# Patient Record
Sex: Male | Born: 1995 | Race: White | Hispanic: No | Marital: Single | Smoking: Never smoker
Health system: Southern US, Community
[De-identification: ages and names within clinical notes are randomized; demographics above are authoritative.]

## PROBLEM LIST (undated history)

## (undated) DIAGNOSIS — C6290 Malignant neoplasm of unspecified testis, unspecified whether descended or undescended: Secondary | ICD-10-CM

## (undated) DIAGNOSIS — Z9889 Other specified postprocedural states: Secondary | ICD-10-CM

## (undated) DIAGNOSIS — F411 Generalized anxiety disorder: Secondary | ICD-10-CM

## (undated) DIAGNOSIS — F329 Major depressive disorder, single episode, unspecified: Secondary | ICD-10-CM

## (undated) DIAGNOSIS — N5089 Other specified disorders of the male genital organs: Secondary | ICD-10-CM

## (undated) DIAGNOSIS — L309 Dermatitis, unspecified: Secondary | ICD-10-CM

## (undated) DIAGNOSIS — C801 Malignant (primary) neoplasm, unspecified: Secondary | ICD-10-CM

## (undated) DIAGNOSIS — Z973 Presence of spectacles and contact lenses: Secondary | ICD-10-CM

---
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
## 2017-01-31 ENCOUNTER — Ambulatory Visit (INDEPENDENT_AMBULATORY_CARE_PROVIDER_SITE_OTHER): Payer: 59 | Admitting: Psychology

## 2017-01-31 DIAGNOSIS — F331 Major depressive disorder, recurrent, moderate: Secondary | ICD-10-CM | POA: Diagnosis not present

## 2017-02-09 ENCOUNTER — Ambulatory Visit (INDEPENDENT_AMBULATORY_CARE_PROVIDER_SITE_OTHER): Payer: 59 | Admitting: Psychology

## 2017-02-09 DIAGNOSIS — F331 Major depressive disorder, recurrent, moderate: Secondary | ICD-10-CM | POA: Diagnosis not present

## 2017-02-16 ENCOUNTER — Ambulatory Visit: Payer: Self-pay | Admitting: Psychology

## 2017-02-21 ENCOUNTER — Ambulatory Visit (INDEPENDENT_AMBULATORY_CARE_PROVIDER_SITE_OTHER): Payer: 59 | Admitting: Psychology

## 2017-02-21 DIAGNOSIS — F331 Major depressive disorder, recurrent, moderate: Secondary | ICD-10-CM | POA: Diagnosis not present

## 2017-02-27 ENCOUNTER — Ambulatory Visit (INDEPENDENT_AMBULATORY_CARE_PROVIDER_SITE_OTHER): Payer: 59 | Admitting: Psychology

## 2017-02-27 DIAGNOSIS — F331 Major depressive disorder, recurrent, moderate: Secondary | ICD-10-CM

## 2017-03-27 ENCOUNTER — Encounter: Payer: Self-pay | Admitting: Family

## 2017-03-27 ENCOUNTER — Ambulatory Visit (INDEPENDENT_AMBULATORY_CARE_PROVIDER_SITE_OTHER): Payer: 59 | Admitting: Family

## 2017-03-27 VITALS — BP 110/70 | HR 94 | Temp 98.2°F | Resp 16 | Ht 68.0 in | Wt 129.8 lb

## 2017-03-27 DIAGNOSIS — Z0000 Encounter for general adult medical examination without abnormal findings: Secondary | ICD-10-CM

## 2017-03-27 DIAGNOSIS — Z00129 Encounter for routine child health examination without abnormal findings: Principal | ICD-10-CM

## 2017-03-27 NOTE — Progress Notes (Signed)
Subjective:    Patient ID: Ricky Williams, male    DOB: 1995/09/29, 21 y.o.   MRN: [id number]  Chief Complaint  Patient presents with  . Establish Care    CPE    HPI:  Ricky Williams is a 21 y.o. male who presents today for an annual wellness visit.His mother is present today for the office visit with his permission.   1) Health Maintenance -   Diet - Averaging about 3 meals per day with snacks; No caffeine intake  Exercise - No structured exercise   2) Preventative Exams / Immunizations:  Dental -- Up to date  Vision -- Up to date   Health Maintenance  Topic Date Due  . HIV Screening  03/12/2013  . TETANUS/TDAP  03/12/2017  . INFLUENZA VACCINE  04/05/2017     There is no immunization history on file for this patient.   No Known Allergies   Outpatient Medications Prior to Visit  Medication Sig Dispense Refill  . Multiple Vitamins-Minerals (MULTIVITAMIN WITH MINERALS) tablet Take 1 tablet by mouth daily.    Marland Kitchen acetaminophen (TYLENOL) 500 MG tablet Take 500-1,000 mg by mouth every 6 (six) hours as needed for mild pain.    . cephALEXin (KEFLEX) 500 MG capsule Take 1 capsule (500 mg total) by mouth 3 (three) times daily. 30 capsule 0  . cyproheptadine (PERIACTIN) 4 MG tablet Take 4 mg by mouth daily.    Marland Kitchen dicyclomine (BENTYL) 20 MG tablet Take 1 tablet (20 mg total) by mouth 4 (four) times daily -  before meals and at bedtime. For belly pain 30 tablet 0  . omeprazole (PRILOSEC) 40 MG capsule Take 40 mg by mouth 2 (two) times daily.    . ondansetron (ZOFRAN) 4 MG tablet Take 4 mg by mouth every 8 (eight) hours as needed for nausea or vomiting.    . Vitamin D, Ergocalciferol, (DRISDOL) 50000 UNITS CAPS capsule Take 50,000 Units by mouth every 7 (seven) days.     No facility-administered medications prior to visit.      Past Medical History:  Diagnosis Date  . Allergy   . Anxiety   . Depression      History reviewed. No pertinent surgical  history.   Family History  Problem Relation Age of Onset  . Breast cancer Mother   . Prostate cancer Father   . Kidney Stones Father   . Arthritis Father        Psoriatic   . Multiple myeloma Maternal Grandmother   . Melanoma Maternal Grandfather   . Breast cancer Paternal Grandmother   . Healthy Paternal Grandfather      Social History   Social History  . Marital status: Single    Spouse name: N/A  . Number of children: 0  . Years of education: 12   Occupational History  . Student     [employer] about Biology   Social History Main Topics  . Smoking status: Never Smoker  . Smokeless tobacco: Never Used  . Alcohol use No  . Drug use: No  . Sexual activity: No   Other Topics Concern  . Not on file   Social History Narrative   Fun/Hobby: Video games       Review of Systems  Constitutional: Denies fever, chills, fatigue, or significant weight gain/loss. HENT: Head: Denies headache or neck pain Ears: Denies changes in hearing, ringing in ears, earache, drainage Nose: Denies discharge, stuffiness, itching, nosebleed, sinus pain Throat: Denies sore throat,  hoarseness, dry mouth, sores, thrush Eyes: Denies loss/changes in vision, pain, redness, blurry/double vision, flashing lights Cardiovascular: Denies chest pain/discomfort, tightness, palpitations, shortness of breath with activity, difficulty lying down, swelling, sudden awakening with shortness of breath Respiratory: Denies shortness of breath, cough, sputum production, wheezing Gastrointestinal: Denies dysphasia, heartburn, change in appetite, nausea, change in bowel habits, rectal bleeding, constipation, diarrhea, yellow skin or eyes Genitourinary: Denies frequency, urgency, burning/pain, blood in urine, incontinence, change in urinary strength. Musculoskeletal: Denies muscle/joint pain, stiffness, back pain, redness or swelling of joints, trauma Skin: Denies rashes, lumps, itching, dryness, color changes,  or hair/nail changes Neurological: Denies dizziness, fainting, seizures, weakness, numbness, tingling, tremor Psychiatric - Denies nervousness, stress, depression or memory loss Endocrine: Denies heat or cold intolerance, sweating, frequent urination, excessive thirst, changes in appetite Hematologic: Denies ease of bruising or bleeding     Objective:     BP 110/70 (BP Location: Left Arm, Patient Position: Sitting, Cuff Size: Normal)   Pulse 94   Temp 98.2 F (36.8 C) (Oral)   Resp 16   Ht '5\' 8"'$  (1.727 m)   Wt 129 lb 12.8 oz (58.9 kg)   SpO2 97%   BMI 19.74 kg/m  Nursing note and vital signs reviewed.  Physical Exam  Constitutional: He is oriented to person, place, and time. He appears well-developed and well-nourished. No distress.  HENT:  Head: Normocephalic.  Right Ear: Hearing, tympanic membrane, external ear and ear canal normal.  Left Ear: Hearing, tympanic membrane, external ear and ear canal normal.  Nose: Nose normal.  Mouth/Throat: Uvula is midline, oropharynx is clear and moist and mucous membranes are normal.  Bilateral cerumen noted.   Eyes: Pupils are equal, round, and reactive to light. Conjunctivae and EOM are normal.  Neck: Neck supple. No JVD present. No tracheal deviation present. No thyromegaly present.  Cardiovascular: Normal rate, regular rhythm, normal heart sounds and intact distal pulses.   Pulmonary/Chest: Effort normal and breath sounds normal.  Abdominal: Soft. Bowel sounds are normal. He exhibits no distension and no mass. There is no tenderness. There is no rebound and no guarding.  Musculoskeletal: Normal range of motion. He exhibits no edema or tenderness.  Lymphadenopathy:    He has no cervical adenopathy.  Neurological: He is alert and oriented to person, place, and time. He has normal reflexes. No cranial nerve deficit. He exhibits normal muscle tone. Coordination normal.  Skin: Skin is warm and dry.  Psychiatric: He has a normal mood and  affect. His behavior is normal. Judgment and thought content normal.       Assessment & Plan:   Problem List Items Addressed This Visit      Other   Well adolescent visit - Primary    1) Anticipatory Guidance: Discussed importance of wearing a seatbelt while driving and not texting while driving; changing batteries in smoke detector at least once annually; wearing suntan lotion when outside; eating a balanced and moderate diet; getting physical activity at least 30 minutes per day.  2) Immunizations / Screenings / Labs:  All immunizations are up-to-date per recommendations. All screenings are up-to-date per recommendations. Obtain CBC, CMET, and lipid profile.    Overall well exam with risk factors for cardiovascular disease being minimal. He is getting ready to start college in the fall and planning to study biology at the [employer]. Indicates school has gone well. He continues to be treated for depression and anxiety by psychiatry. He is able to function on a daily  basis. Appears to be growing well with vital signs being normal as well as a normal BMI. Discussed importance of continued nutritional intake that is moderate, balance, and varied. Increase physical activity to 30 minutes of moderate level activity daily or approximately 10,000 steps per day. Continue healthy lifestyle behaviors and choices. Follow-up prevention exam in 1 year. Follow-up office visit pending blood work as needed.      Relevant Orders   CBC   Comprehensive metabolic panel   Lipid panel       I have discontinued Mr. Sarff's omeprazole, cyproheptadine, ondansetron, Vitamin D (Ergocalciferol), acetaminophen, dicyclomine, and cephALEXin. I am also having him maintain his multivitamin with minerals, buPROPion, desvenlafaxine, Melatonin, and Magnesium Oxide.   Meds ordered this encounter  Medications  . buPROPion (WELLBUTRIN) 100 MG tablet    Sig: Take 100 mg by mouth 2 (two)  times daily.  Marland Kitchen desvenlafaxine (PRISTIQ) 100 MG 24 hr tablet    Sig: Take 100 mg by mouth daily.  . Melatonin 5 MG TABS    Sig: Take 5 mg by mouth.  . Magnesium Oxide 250 MG TABS    Sig: Take 250 mg by mouth.     Follow-up: Return in about 1 year (around 03/27/2018), or if symptoms worsen or fail to improve.   Mauricio Po, FNP

## 2017-03-27 NOTE — Assessment & Plan Note (Signed)
1) Anticipatory Guidance: Discussed importance of wearing a seatbelt while driving and not texting while driving; changing batteries in smoke detector at least once annually; wearing suntan lotion when outside; eating a balanced and moderate diet; getting physical activity at least 30 minutes per day.  2) Immunizations / Screenings / Labs:  All immunizations are up-to-date per recommendations. All screenings are up-to-date per recommendations. Obtain CBC, CMET, and lipid profile.    Overall well exam with risk factors for cardiovascular disease being minimal. He is getting ready to start college in the fall and planning to study biology at the [employer]. Indicates school has gone well. He continues to be treated for depression and anxiety by psychiatry. He is able to function on a daily basis. Appears to be growing well with vital signs being normal as well as a normal BMI. Discussed importance of continued nutritional intake that is moderate, balance, and varied. Increase physical activity to 30 minutes of moderate level activity daily or approximately 10,000 steps per day. Continue healthy lifestyle behaviors and choices. Follow-up prevention exam in 1 year. Follow-up office visit pending blood work as needed.

## 2017-03-27 NOTE — Patient Instructions (Addendum)
Thank you for choosing [facility].  SUMMARY AND INSTRUCTIONS:  Ensure to eat fiber and drink water.   Miralax as needed.  Work on increasing physical activity.   For your ears:  Murine or Debrox  To prevent wax buildup within the ear:   Use equal parts of water and white vinegar  Soak a cotton ball in the solution and place several drops within the ear  Insert cotton ball in external ear canal and let sit for 30 minutes prior to shower  Remove cotton ball and gently irrigate the ear canal in the shower.  Do not irrigate directly into the ear but rather let it hit the external canal and irrigate.  For maintenance, this can be done 1 time weekly.  Labs:  Please stop by the lab on the lower level of the building for your blood work. Your results will be released to [place] (or called to you) after review, usually within 72 hours after test completion. If any changes need to be made, you will be notified at that same time.  1.) The lab is open from 7:30am to 5:30 pm Monday-Friday 2.) No appointment is necessary 3.) Fasting (if needed) is 6-8 hours after food and drink; black coffee and water are okay   Follow up:  If your symptoms worsen or fail to improve, please contact our office for further instruction, or in case of emergency go directly to the emergency room at the closest medical facility.     Health Maintenance, Male A healthy lifestyle and preventive care is important for your health and wellness. Ask your health care provider about what schedule of regular examinations is right for you. What should I know about weight and diet? Eat a Healthy Diet  Eat plenty of vegetables, fruits, whole grains, low-fat dairy products, and lean protein.  Do not eat a lot of foods high in solid fats, added sugars, or salt.  Maintain a Healthy Weight Regular exercise can help you achieve or maintain a healthy weight. You should:  Do at least 150 minutes of exercise  each week. The exercise should increase your heart rate and make you sweat (moderate-intensity exercise).  Do strength-training exercises at least twice a week.  Watch Your Levels of Cholesterol and Blood Lipids  Have your blood tested for lipids and cholesterol every 5 years starting at 19 years of age. If you are at high risk for heart disease, you should start having your blood tested when you are 19 years old. You may need to have your cholesterol levels checked more often if: ? Your lipid or cholesterol levels are high. ? You are older than 19 years of age. ? You are at high risk for heart disease.  What should I know about cancer screening? Many types of cancers can be detected early and may often be prevented. Lung Cancer  You should be screened every year for lung cancer if: ? You are a current smoker who has smoked for at least 30 years. ? You are a former smoker who has quit within the past 15 years.  Talk to your health care provider about your screening options, when you should start screening, and how often you should be screened.  Colorectal Cancer  Routine colorectal cancer screening usually begins at 19 years of age and should be repeated every 5-10 years until you are 19 years old. You may need to be screened more often if early forms of precancerous polyps or small growths are found.  Your health care provider may recommend screening at an earlier age if you have risk factors for colon cancer.  Your health care provider may recommend using home test kits to check for hidden blood in the stool.  A small camera at the end of a tube can be used to examine your colon (sigmoidoscopy or colonoscopy). This checks for the earliest forms of colorectal cancer.  Prostate and Testicular Cancer  Depending on your age and overall health, your health care provider may do certain tests to screen for prostate and testicular cancer.  Talk to your health care provider about any  symptoms or concerns you have about testicular or prostate cancer.  Skin Cancer  Check your skin from head to toe regularly.  Tell your health care provider about any new moles or changes in moles, especially if: ? There is a change in a mole's size, shape, or color. ? You have a mole that is larger than a pencil eraser.  Always use sunscreen. Apply sunscreen liberally and repeat throughout the day.  Protect yourself by wearing long sleeves, pants, a wide-brimmed hat, and sunglasses when outside.  What should I know about heart disease, diabetes, and high blood pressure?  If you are 62-80 years of age, have your blood pressure checked every 3-5 years. If you are 63 years of age or older, have your blood pressure checked every year. You should have your blood pressure measured twice-once when you are at a hospital or clinic, and once when you are not at a hospital or clinic. Record the average of the two measurements. To check your blood pressure when you are not at a hospital or clinic, you can use: ? An automated blood pressure machine at a pharmacy. ? A home blood pressure monitor.  Talk to your health care provider about your target blood pressure.  If you are between 40-36 years old, ask your health care provider if you should take aspirin to prevent heart disease.  Have regular diabetes screenings by checking your fasting blood sugar level. ? If you are at a normal weight and have a low risk for diabetes, have this test once every three years after the age of 75. ? If you are overweight and have a high risk for diabetes, consider being tested at a younger age or more often.  A one-time screening for abdominal aortic aneurysm (AAA) by ultrasound is recommended for men aged 2-75 years who are current or former smokers. What should I know about preventing infection? Hepatitis B If you have a higher risk for hepatitis B, you should be screened for this virus. Talk with your health  care provider to find out if you are at risk for hepatitis B infection. Hepatitis C Blood testing is recommended for:  Everyone born from [birth year] through 1963.  Anyone with known risk factors for hepatitis C.  Sexually Transmitted Diseases (STDs)  You should be screened each year for STDs including gonorrhea and chlamydia if: ? You are sexually active and are younger than 19 years of age. ? You are older than 19 years of age and your health care provider tells you that you are at risk for this type of infection. ? Your sexual activity has changed since you were last screened and you are at an increased risk for chlamydia or gonorrhea. Ask your health care provider if you are at risk.  Talk with your health care provider about whether you are at high risk of being infected with HIV.  Your health care provider may recommend a prescription medicine to help prevent HIV infection.  What else can I do?  Schedule regular health, dental, and eye exams.  Stay current with your vaccines (immunizations).  Do not use any tobacco products, such as cigarettes, chewing tobacco, and e-cigarettes. If you need help quitting, ask your health care provider.  Limit alcohol intake to no more than 2 drinks per day. One drink equals 12 ounces of beer, 5 ounces of wine, or 1 ounces of hard liquor.  Do not use street drugs.  Do not share needles.  Ask your health care provider for help if you need support or information about quitting drugs.  Tell your health care provider if you often feel depressed.  Tell your health care provider if you have ever been abused or do not feel safe at home. This information is not intended to replace advice given to you by your health care provider. Make sure you discuss any questions you have with your health care provider. Document Released: 02/18/2008 Document Revised: 04/20/2016 Document Reviewed: 05/26/2015 Elsevier Interactive Patient Education  [employer].

## 2017-03-30 ENCOUNTER — Ambulatory Visit (INDEPENDENT_AMBULATORY_CARE_PROVIDER_SITE_OTHER): Payer: 59 | Admitting: Psychology

## 2017-03-30 DIAGNOSIS — F331 Major depressive disorder, recurrent, moderate: Secondary | ICD-10-CM | POA: Diagnosis not present

## 2017-04-04 ENCOUNTER — Ambulatory Visit: Payer: 59 | Admitting: Psychology

## 2017-04-06 ENCOUNTER — Ambulatory Visit (INDEPENDENT_AMBULATORY_CARE_PROVIDER_SITE_OTHER): Payer: 59 | Admitting: Psychology

## 2017-04-06 DIAGNOSIS — F331 Major depressive disorder, recurrent, moderate: Secondary | ICD-10-CM

## 2017-04-11 ENCOUNTER — Ambulatory Visit: Payer: 59 | Admitting: Psychology

## 2017-04-20 ENCOUNTER — Ambulatory Visit: Payer: 59 | Admitting: Psychology

## 2017-04-27 ENCOUNTER — Ambulatory Visit: Payer: 59 | Admitting: Psychology

## 2017-05-11 ENCOUNTER — Ambulatory Visit: Payer: Self-pay | Admitting: Psychology

## 2017-05-25 ENCOUNTER — Ambulatory Visit (INDEPENDENT_AMBULATORY_CARE_PROVIDER_SITE_OTHER): Payer: 59 | Admitting: Psychology

## 2017-05-25 DIAGNOSIS — F321 Major depressive disorder, single episode, moderate: Secondary | ICD-10-CM | POA: Diagnosis not present

## 2017-06-08 ENCOUNTER — Ambulatory Visit: Payer: 59 | Admitting: Psychology

## 2017-06-15 ENCOUNTER — Ambulatory Visit (INDEPENDENT_AMBULATORY_CARE_PROVIDER_SITE_OTHER): Payer: 59 | Admitting: Psychology

## 2017-06-15 DIAGNOSIS — F331 Major depressive disorder, recurrent, moderate: Secondary | ICD-10-CM | POA: Diagnosis not present

## 2017-06-22 ENCOUNTER — Ambulatory Visit: Payer: Self-pay | Admitting: Psychology

## 2017-07-06 ENCOUNTER — Ambulatory Visit: Payer: 59 | Admitting: Psychology

## 2017-07-20 ENCOUNTER — Ambulatory Visit: Payer: Self-pay | Admitting: Psychology

## 2017-08-03 ENCOUNTER — Ambulatory Visit: Payer: Self-pay | Admitting: Psychology

## 2017-08-09 ENCOUNTER — Ambulatory Visit: Payer: 59 | Admitting: Psychology

## 2017-08-09 DIAGNOSIS — F331 Major depressive disorder, recurrent, moderate: Secondary | ICD-10-CM | POA: Diagnosis not present

## 2017-08-23 ENCOUNTER — Ambulatory Visit: Payer: 59 | Admitting: Psychology

## 2017-08-23 DIAGNOSIS — F331 Major depressive disorder, recurrent, moderate: Secondary | ICD-10-CM | POA: Diagnosis not present

## 2017-09-06 ENCOUNTER — Ambulatory Visit: Payer: 59 | Admitting: Psychology

## 2017-09-06 DIAGNOSIS — F331 Major depressive disorder, recurrent, moderate: Secondary | ICD-10-CM

## 2017-09-20 ENCOUNTER — Ambulatory Visit: Payer: 59 | Admitting: Psychology

## 2017-09-20 DIAGNOSIS — F331 Major depressive disorder, recurrent, moderate: Secondary | ICD-10-CM

## 2017-09-28 ENCOUNTER — Ambulatory Visit: Payer: 59 | Admitting: Psychiatry

## 2017-09-28 ENCOUNTER — Encounter: Payer: Self-pay | Admitting: Psychiatry

## 2017-09-28 VITALS — BP 109/67 | HR 84 | Temp 98.7°F | Wt 128.2 lb

## 2017-09-28 DIAGNOSIS — F332 Major depressive disorder, recurrent severe without psychotic features: Secondary | ICD-10-CM | POA: Diagnosis not present

## 2017-09-28 NOTE — Progress Notes (Signed)
ECT: This is an ECT consult for this 22 year old man referred by his outpatient psychiatrist.  Patient was accompanied by his parents.  The band appropriate with the history.  I received a letter of referral from his outpatient doctor as well.  The history is all consistent.  Patient reports he has been having severe problems with depression for probably about 3-1/2 years.  He had had anxiety earlier than that but the depression came on pretty badly during high school.  Mood has remained depressed and anxious almost constantly ever since then with only a little bit of respite at times.  There is no report of any trauma or major life change that would have set off the symptoms.  Patient's current symptoms are almost constantly depressed and anxious mood.  Little motivation.  Low energy.  Some trouble sleeping at night.  No suicidal ideation however.  No homicidal ideation.  Denies any hallucinations.  He is currently taking Wellbutrin and Viibryd and a low dose of Xanax and seeing his outpatient psychiatrist.  Past psychiatric history: Never been in the hospital.  No history of suicide attempts.  His outpatient psychiatrist provides a long list of medications that have been tried at full effective dose is an appropriate time length including multiple antidepressants and mood stabilizers.  Substance abuse history: Patient denies alcohol or drug abuse and there is no indication of any past concern about it.  Medical history: No known significant medical problems  Social history: Patient lives with his parents.  He did graduate from high school.  This past semester he attempted to go to [employer] but was not able to complete the semester because of his depressive symptoms.  Mental status exam: Casually dressed young man.  Only occasional eye contact.  Affect very flat and dysphoric.  Speech very slow.  Thoughts slow and withdrawn.  Endorses depressed mood.  Denies suicidal or homicidal thoughts.  Denies  hallucinations or paranoia.  Alert and oriented.  Seems to have appropriate understanding of his situation and appropriate judgment and insight.  Family history: No significant family history reported.  Assessment: This is a 22 year old man with multiple symptoms of major depression that have been going on for years now without lasting response to medication or psychotherapy.  Patient remains very withdrawn and significantly dysfunctional.  In my judgment he would be an appropriate candidate for ECT.  We talked at length about ECT and answered multiple questions and described the risks and benefits of the treatment.  Patient is also considering pursuing transcranial magnetic stimulation.  Although I made it clear that I am not the expert on this I gave him a brief comparison of the differences between the treatment.  I told him that under his current circumstances I thought it would be perfectly reasonable for him to make a decision for either 1 of these treatments and that whenever he decided to pursue ECT I would be available.  Patient does not require commitment or inpatient hospitalization.  No change to medication.  Answered multiple questions from the parents and the patient.  He is to call me back if he decides to pursue ECT treatment at which time we can get the labs and go forward.

## 2017-09-29 ENCOUNTER — Other Ambulatory Visit (HOSPITAL_COMMUNITY): Payer: 59 | Admitting: Psychiatry

## 2017-10-04 ENCOUNTER — Ambulatory Visit: Payer: 59 | Admitting: Psychology

## 2017-10-04 DIAGNOSIS — F331 Major depressive disorder, recurrent, moderate: Secondary | ICD-10-CM | POA: Diagnosis not present

## 2017-10-06 ENCOUNTER — Other Ambulatory Visit: Payer: Self-pay | Admitting: Psychiatry

## 2017-10-06 ENCOUNTER — Encounter
Admission: RE | Admit: 2017-10-06 | Discharge: 2017-10-06 | Disposition: A | Discharge status: Home / Self Care | Payer: 59 | Source: Ambulatory Visit | Attending: Psychiatry | Admitting: Psychiatry

## 2017-10-06 ENCOUNTER — Ambulatory Visit
Admission: RE | Admit: 2017-10-06 | Discharge: 2017-10-06 | Disposition: A | Discharge status: Home / Self Care | Payer: 59 | Source: Ambulatory Visit | Attending: Psychiatry | Admitting: Psychiatry

## 2017-10-06 ENCOUNTER — Other Ambulatory Visit: Payer: Self-pay

## 2017-10-06 ENCOUNTER — Institutional Professional Consult (permissible substitution) (HOSPITAL_COMMUNITY): Payer: 59 | Admitting: Psychiatry

## 2017-10-06 DIAGNOSIS — Z01818 Encounter for other preprocedural examination: Secondary | ICD-10-CM

## 2017-10-06 DIAGNOSIS — F329 Major depressive disorder, single episode, unspecified: Secondary | ICD-10-CM | POA: Insufficient documentation

## 2017-10-06 DIAGNOSIS — Z01812 Encounter for preprocedural laboratory examination: Secondary | ICD-10-CM | POA: Diagnosis not present

## 2017-10-06 LAB — BASIC METABOLIC PANEL
ANION GAP: 8 (ref 5–15)
BUN: 10 mg/dL (ref 6–20)
CO2: 30 mmol/L (ref 22–32)
Calcium: 9.7 mg/dL (ref 8.9–10.3)
Chloride: 101 mmol/L (ref 101–111)
Creatinine, Ser: 0.73 mg/dL (ref 0.61–1.24)
GFR calc Af Amer: >60 mL/min (ref >60)
GLUCOSE: 84 mg/dL (ref 65–99)
POTASSIUM: 4.1 mmol/L (ref 3.5–5.1)
Sodium: 139 mmol/L (ref 135–145)

## 2017-10-06 LAB — CBC
HEMATOCRIT: 45.5 % (ref 40.0–52.0)
Hemoglobin: 15.2 g/dL (ref 13.0–18.0)
MCH: 31 pg (ref 26.0–34.0)
MCHC: 33.5 g/dL (ref 32.0–36.0)
MCV: 92.7 fL (ref 80.0–100.0)
Platelets: 263 10*3/uL (ref 150–440)
RBC: 4.9 MIL/uL (ref 4.40–5.90)
RDW: 12.5 % (ref 11.5–14.5)
WBC: 7.3 10*3/uL (ref 3.8–10.6)

## 2017-10-06 LAB — URINALYSIS, COMPLETE (UACMP) WITH MICROSCOPIC
BACTERIA UA: NONE SEEN
Bilirubin Urine: NEGATIVE
Glucose, UA: NEGATIVE mg/dL
Hgb urine dipstick: NEGATIVE
Ketones, ur: NEGATIVE mg/dL
Leukocytes, UA: NEGATIVE
Nitrite: NEGATIVE
PROTEIN: NEGATIVE mg/dL
RBC / HPF: NONE SEEN RBC/hpf (ref 0–5)
SPECIFIC GRAVITY, URINE: 1.005 (ref 1.005–1.030)
Squamous Epithelial / LPF: NONE SEEN
WBC UA: NONE SEEN WBC/hpf (ref 0–5)
pH: 7 (ref 5.0–8.0)

## 2017-10-06 NOTE — Pre-Procedure Instructions (Signed)
Instructions given by Vilma Meckel RN

## 2017-10-06 NOTE — Pre-Procedure Instructions (Signed)
Discussed the booklet What You Should Know About ECT with patient and mother. Discussed the New Patient Instruction with patient. Discussed the consents and had patient sign consents and care plan for his treatment Monday. This nurse [facility] the [facility] to discuss the process before the procedure of Admissions and took the patient and mother to Same Day Surgery Department and showed them the rooms and dicussed what to expect. This nurse then took the patient and his mother to Radiology for his chest x-ray. Patient and mother verbalized understanding of everything and stated that they had no questions.

## 2017-10-09 ENCOUNTER — Encounter: Payer: Self-pay | Admitting: Anesthesiology

## 2017-10-09 ENCOUNTER — Encounter
Admission: RE | Admit: 2017-10-09 | Discharge: 2017-10-09 | Disposition: A | Discharge status: Home / Self Care | Payer: 59 | Source: Ambulatory Visit | Attending: Psychiatry | Admitting: Psychiatry

## 2017-10-09 DIAGNOSIS — Z803 Family history of malignant neoplasm of breast: Secondary | ICD-10-CM | POA: Insufficient documentation

## 2017-10-09 DIAGNOSIS — Z841 Family history of disorders of kidney and ureter: Secondary | ICD-10-CM | POA: Insufficient documentation

## 2017-10-09 DIAGNOSIS — Z8042 Family history of malignant neoplasm of prostate: Secondary | ICD-10-CM | POA: Diagnosis not present

## 2017-10-09 DIAGNOSIS — F332 Major depressive disorder, recurrent severe without psychotic features: Secondary | ICD-10-CM | POA: Diagnosis not present

## 2017-10-09 DIAGNOSIS — Z8261 Family history of arthritis: Secondary | ICD-10-CM | POA: Diagnosis not present

## 2017-10-09 DIAGNOSIS — F419 Anxiety disorder, unspecified: Secondary | ICD-10-CM | POA: Diagnosis not present

## 2017-10-09 DIAGNOSIS — F329 Major depressive disorder, single episode, unspecified: Secondary | ICD-10-CM | POA: Insufficient documentation

## 2017-10-09 MED ORDER — ONDANSETRON HCL 4 MG/2ML IJ SOLN: 4.0000 mg | Freq: Once | INTRAMUSCULAR | Status: DC | PRN

## 2017-10-09 MED ORDER — MIDAZOLAM HCL 2 MG/2ML IJ SOLN
INTRAMUSCULAR | Status: DC | PRN
  Administered 2017-10-09: 2 mg via INTRAVENOUS

## 2017-10-09 MED ORDER — SODIUM CHLORIDE 0.9 % IV SOLN
INTRAVENOUS | Status: DC | PRN
  Administered 2017-10-09: 10:00:00 via INTRAVENOUS

## 2017-10-09 MED ORDER — SUCCINYLCHOLINE CHLORIDE 20 MG/ML IJ SOLN
INTRAMUSCULAR | Status: AC
  Filled 2017-10-09: qty 1

## 2017-10-09 MED ORDER — METHOHEXITAL SODIUM 100 MG/10ML IV SOSY
PREFILLED_SYRINGE | INTRAVENOUS | Status: DC | PRN
  Administered 2017-10-09: 60 mg via INTRAVENOUS

## 2017-10-09 MED ORDER — SODIUM CHLORIDE 0.9 % IV SOLN
500.0000 mL | Freq: Once | INTRAVENOUS | Status: AC
  Administered 2017-10-09: 500 mL via INTRAVENOUS

## 2017-10-09 MED ORDER — SUCCINYLCHOLINE CHLORIDE 20 MG/ML IJ SOLN
INTRAMUSCULAR | Status: DC | PRN
  Administered 2017-10-09: 80 mg via INTRAVENOUS

## 2017-10-09 MED ORDER — MIDAZOLAM HCL 2 MG/2ML IJ SOLN
INTRAMUSCULAR | Status: AC
  Filled 2017-10-09: qty 2

## 2017-10-09 MED ORDER — FENTANYL CITRATE (PF) 100 MCG/2ML IJ SOLN: 25.0000 ug | INTRAMUSCULAR | Status: DC | PRN

## 2017-10-09 NOTE — Anesthesia Preprocedure Evaluation (Signed)
Anesthesia Evaluation  Patient identified by MRN, date of birth, ID band Patient awake    Reviewed: Allergy & Precautions, NPO status , Patient's Chart, lab work & pertinent test results  Airway Mallampati: II  TM Distance: >3 FB     Dental  (+) Teeth Intact   Pulmonary neg pulmonary ROS,    Pulmonary exam normal        Cardiovascular negative cardio ROS Normal cardiovascular exam     Neuro/Psych PSYCHIATRIC DISORDERS Anxiety Depression negative neurological ROS     GI/Hepatic negative GI ROS, Neg liver ROS,   Endo/Other  negative endocrine ROS  Renal/GU negative Renal ROS  negative genitourinary   Musculoskeletal negative musculoskeletal ROS (+)   Abdominal Normal abdominal exam  (+)   Peds negative pediatric ROS (+)  Hematology negative hematology ROS (+)   Anesthesia Other Findings   Reproductive/Obstetrics                             Anesthesia Physical Anesthesia Plan  ASA: I  Anesthesia Plan: General   Post-op Pain Management:    Induction: Intravenous  PONV Risk Score and Plan:   Airway Management Planned: Mask  Additional Equipment:   Intra-op Plan:   Post-operative Plan:   Informed Consent: I have reviewed the patients History and Physical, chart, labs and discussed the procedure including the risks, benefits and alternatives for the proposed anesthesia with the patient or authorized representative who has indicated his/her understanding and acceptance.   Dental advisory given  Plan Discussed with: CRNA and Surgeon  Anesthesia Plan Comments:         Anesthesia Quick Evaluation

## 2017-10-09 NOTE — Procedures (Signed)
ECT SERVICES Physician's Interval Evaluation & Treatment Note  Patient Identification: Ricky Williams MRN:  [id number] Date of Evaluation:  10/09/2017 TX #: 1  MADRS: 32  MMSE: 30  P.E. Findings:  Heart and lungs normal.  Vitals all normal.  No significant findings.  Neurologically intact.  Psychiatric Interval Note:  Severe depression not suicidal however not psychotic  Subjective:  Patient is a 22 y.o. male seen for evaluation for Electroconvulsive Therapy. Major depression  Treatment Summary:   [x]   Right Unilateral             []  Bilateral   % Energy : 0.3 ms 25%   Impedance: 1040 ohms  Seizure Energy Index: 31,705 V squared  Postictal Suppression Index: 96%  Seizure Concordance Index: 96%  Medications  Pre Shock: Brevital 60 mg, succinylcholine 80 mg  Post Shock: Versed 2 mg  Seizure Duration: 55 seconds by EMG 102 seconds by EEG   Comments: Follow-up Wednesday Friday and etc.  Lungs:  [x]   Clear to auscultation               []  Other:   Heart:    [x]   Regular rhythm             []  irregular rhythm    [x]   Previous H&P reviewed, patient examined and there are NO CHANGES                 []   Previous H&P reviewed, patient examined and there are changes noted.   Alethia Berthold, MD 2/4/201910:30 AM

## 2017-10-09 NOTE — Discharge Instructions (Signed)
1)  The drugs that you have been given will stay in your system until tomorrow so for the       next 24 hours you should not:  A. Drive an automobile  B. Make any legal decisions  C. Drink any alcoholic beverages  2)  You may resume your regular meals upon return home.  3)  A responsible adult must take you home.  Someone should stay with you for a few          hours, then be available by phone for the remainder of the treatment day.  4)  You May experience any of the following symptoms:  Headache, Nausea and a dry mouth (due to the medications you were given),  temporary memory loss and some confusion, or sore muscles (a warm bath  should help this).  If you you experience any of these symptoms let us know on                your return visit.  5)  Report any of the following: any acute discomfort, severe headache, or temperature        greater than 100.5 F.   Also report any unusual redness, swelling, drainage, or pain         at your IV site.    You may report Symptoms to:  [place] [facility]          Phone: [phone], ECT Department           or Dr. Prescott Gum office [phone]  6)  Your next ECT Treatment is Day Wednesday  Date February 4  We will call 2 days prior to your scheduled appointment for arrival times.  Copy of ECT consent form given to family  58)  Nothing to eat or drink after midnight the night before your procedure.  8)  Take .    With a sip of water the morning of your procedure.  9)  Other Instructions: Call [phone] to cancel the morning of your procedure due         to illness or emergency.  10) We will call within 72 hours to assess how you are feeling.

## 2017-10-09 NOTE — H&P (Signed)
Ricky Williams is an 22 y.o. male.   Chief Complaint: Patient with recurrent severe depression beginning course of index ECT HPI: Patient with a history of severe depression.  Several medication trials without benefit  Past Medical History:  Diagnosis Date  . Allergy   . Anxiety   . Depression     History reviewed. No pertinent surgical history.  Family History  Problem Relation Age of Onset  . Breast cancer Mother   . Prostate cancer Father   . Kidney Stones Father   . Arthritis Father        Psoriatic   . Multiple myeloma Maternal Grandmother   . Melanoma Maternal Grandfather   . Breast cancer Paternal Grandmother   . Healthy Paternal Grandfather    Social History:  reports that  has never smoked. he has never used smokeless tobacco. He reports that he does not drink alcohol or use drugs.  Allergies: No Known Allergies   (Not in a hospital admission)  No results found for this or any previous visit (from the past 48 hour(s)). No results found.  Review of Systems  Constitutional: Negative.   HENT: Negative.   Eyes: Negative.   Respiratory: Negative.   Cardiovascular: Negative.   Gastrointestinal: Negative.   Musculoskeletal: Negative.   Skin: Negative.   Neurological: Negative.   Psychiatric/Behavioral: Positive for depression. Negative for hallucinations, memory loss, substance abuse and suicidal ideas. The patient is nervous/anxious. The patient does not have insomnia.     Blood pressure 107/61, pulse 76, temperature 98 F (36.7 C), temperature source Oral, resp. rate 16, height 5' 10"  (1.778 m), weight 58.5 kg (129 lb), SpO2 99 %. Physical Exam  Nursing note and vitals reviewed. Constitutional: He appears well-developed and well-nourished.  HENT:  Head: Normocephalic and atraumatic.  Eyes: Conjunctivae are normal. Pupils are equal, round, and reactive to light.  Neck: Normal range of motion.  Cardiovascular: Regular rhythm and normal heart sounds.   Respiratory: Effort normal and breath sounds normal. No respiratory distress.  GI: Soft.  Musculoskeletal: Normal range of motion.  Neurological: He is alert.  Skin: Skin is warm and dry.  Psychiatric: Judgment normal. His speech is delayed. He is slowed and withdrawn. Cognition and memory are normal. He exhibits a depressed mood. He expresses no suicidal ideation.     Assessment/Plan Beginning index course ECT today using right unilateral treatment.  Plan risks benefits all discussed with patient and family  Alethia Berthold, MD 10/09/2017, 10:28 AM

## 2017-10-09 NOTE — Transfer of Care (Signed)
Immediate Anesthesia Transfer of Care Note  Patient: Ricky Williams  Procedure(s) Performed: ECT TX  Patient Location: PACU  Anesthesia Type:General  Level of Consciousness: sedated  Airway & Oxygen Therapy: Patient connected to face mask oxygen  Post-op Assessment: Post -op Vital signs reviewed and stable  Post vital signs: stable  Last Vitals:  Vitals:   10/09/17 0845  BP: 107/61  Pulse: 76  Resp: 16  Temp: 36.7 C  SpO2: 99%    Last Pain:  Vitals:   10/09/17 0845  TempSrc: Oral         Complications: No apparent anesthesia complications

## 2017-10-09 NOTE — Anesthesia Postprocedure Evaluation (Signed)
Anesthesia Post Note  Patient: Ricky Williams  Procedure(s) Performed: ECT TX  Patient location during evaluation: PACU Anesthesia Type: General Level of consciousness: awake and alert and oriented Pain management: pain level controlled Vital Signs Assessment: post-procedure vital signs reviewed and stable Respiratory status: spontaneous breathing Cardiovascular status: blood pressure returned to baseline Anesthetic complications: no     Last Vitals:  Vitals:   10/09/17 1120 10/09/17 1135  BP: (!) 90/53 112/71  Pulse: 73 83  Resp: 18 (!) 8  Temp: 36.6 C   SpO2: 96%     Last Pain:  Vitals:   10/09/17 0845  TempSrc: Oral                 Montrice Gracey

## 2017-10-09 NOTE — Anesthesia Post-op Follow-up Note (Signed)
Anesthesia QCDR form completed.        

## 2017-10-10 ENCOUNTER — Telehealth (HOSPITAL_COMMUNITY): Payer: Self-pay | Admitting: *Deleted

## 2017-10-10 ENCOUNTER — Other Ambulatory Visit: Payer: Self-pay | Admitting: Psychiatry

## 2017-10-10 NOTE — Telephone Encounter (Signed)
Called for authorization of ECT. Spoke with Sheryl A who gave approval #[id number] for 6 sessions from 10/09/17-04/08/18.

## 2017-10-11 ENCOUNTER — Encounter (HOSPITAL_BASED_OUTPATIENT_CLINIC_OR_DEPARTMENT_OTHER)
Admission: RE | Admit: 2017-10-11 | Discharge: 2017-10-11 | Disposition: A | Discharge status: Home / Self Care | Payer: 59 | Source: Ambulatory Visit | Attending: Psychiatry | Admitting: Psychiatry

## 2017-10-11 ENCOUNTER — Encounter: Payer: Self-pay | Admitting: Anesthesiology

## 2017-10-11 DIAGNOSIS — F332 Major depressive disorder, recurrent severe without psychotic features: Secondary | ICD-10-CM | POA: Diagnosis not present

## 2017-10-11 DIAGNOSIS — F329 Major depressive disorder, single episode, unspecified: Secondary | ICD-10-CM | POA: Diagnosis not present

## 2017-10-11 MED ORDER — ONDANSETRON HCL 4 MG/2ML IJ SOLN
4.0000 mg | Freq: Once | INTRAMUSCULAR | Status: AC
  Administered 2017-10-11: 4 mg via INTRAVENOUS

## 2017-10-11 MED ORDER — KETOROLAC TROMETHAMINE 30 MG/ML IJ SOLN
INTRAMUSCULAR | Status: AC
  Filled 2017-10-11: qty 1

## 2017-10-11 MED ORDER — SODIUM CHLORIDE 0.9 % IV SOLN
INTRAVENOUS | Status: DC | PRN
  Administered 2017-10-11: 10:00:00 via INTRAVENOUS

## 2017-10-11 MED ORDER — KETOROLAC TROMETHAMINE 30 MG/ML IJ SOLN
30.0000 mg | Freq: Once | INTRAMUSCULAR | Status: AC
  Administered 2017-10-11: 30 mg via INTRAVENOUS

## 2017-10-11 MED ORDER — METHOHEXITAL SODIUM 100 MG/10ML IV SOSY
PREFILLED_SYRINGE | INTRAVENOUS | Status: DC | PRN
  Administered 2017-10-11: 80 mg via INTRAVENOUS

## 2017-10-11 MED ORDER — ONDANSETRON HCL 4 MG/2ML IJ SOLN
INTRAMUSCULAR | Status: AC
  Administered 2017-10-11: 4 mg via INTRAVENOUS
  Filled 2017-10-11: qty 2

## 2017-10-11 MED ORDER — SODIUM CHLORIDE 0.9 % IV SOLN
500.0000 mL | Freq: Once | INTRAVENOUS | Status: AC
  Administered 2017-10-11: 500 mL via INTRAVENOUS

## 2017-10-11 MED ORDER — SUCCINYLCHOLINE CHLORIDE 200 MG/10ML IV SOSY
PREFILLED_SYRINGE | INTRAVENOUS | Status: DC | PRN
  Administered 2017-10-11: 100 mg via INTRAVENOUS

## 2017-10-11 MED ORDER — MIDAZOLAM HCL 2 MG/2ML IJ SOLN
INTRAMUSCULAR | Status: DC | PRN
  Administered 2017-10-11: 2 mg via INTRAVENOUS

## 2017-10-11 MED ORDER — SUCCINYLCHOLINE CHLORIDE 20 MG/ML IJ SOLN
INTRAMUSCULAR | Status: AC
  Filled 2017-10-11: qty 1

## 2017-10-11 MED ORDER — MIDAZOLAM HCL 2 MG/2ML IJ SOLN
INTRAMUSCULAR | Status: AC
  Filled 2017-10-11: qty 2

## 2017-10-11 NOTE — Anesthesia Postprocedure Evaluation (Signed)
Anesthesia Post Note  Patient: Ricky Williams  Procedure(s) Performed: ECT TX  Patient location during evaluation: PACU Anesthesia Type: General Level of consciousness: awake and alert Pain management: pain level controlled Vital Signs Assessment: post-procedure vital signs reviewed and stable Respiratory status: spontaneous breathing, nonlabored ventilation, respiratory function stable and patient connected to nasal cannula oxygen Cardiovascular status: blood pressure returned to baseline and stable Postop Assessment: no apparent nausea or vomiting Anesthetic complications: no     Last Vitals:  Vitals:   10/11/17 1126 10/11/17 1136  BP: 105/62 101/63  Pulse: 67   Resp: 17   Temp:  36.9 C  SpO2: 99%     Last Pain:  Vitals:   10/11/17 0917  TempSrc: Oral                 Precious Haws [name]

## 2017-10-11 NOTE — Anesthesia Preprocedure Evaluation (Signed)
Anesthesia Evaluation  Patient identified by MRN, date of birth, ID band Patient awake    Reviewed: Allergy & Precautions, NPO status , Patient's Chart, lab work & pertinent test results  History of Anesthesia Complications Negative for: history of anesthetic complications  Airway Mallampati: II  TM Distance: >3 FB     Dental  (+) Teeth Intact   Pulmonary neg pulmonary ROS, neg shortness of breath,    Pulmonary exam normal        Cardiovascular Exercise Tolerance: Good negative cardio ROS Normal cardiovascular exam     Neuro/Psych PSYCHIATRIC DISORDERS Anxiety Depression negative neurological ROS     GI/Hepatic negative GI ROS, Neg liver ROS,   Endo/Other  negative endocrine ROS  Renal/GU negative Renal ROS  negative genitourinary   Musculoskeletal negative musculoskeletal ROS (+)   Abdominal Normal abdominal exam  (+)   Peds negative pediatric ROS (+)  Hematology negative hematology ROS (+)   Anesthesia Other Findings Past Medical History: No date: Allergy No date: Anxiety No date: Depression  History reviewed. No pertinent surgical history.   Reproductive/Obstetrics                             Anesthesia Physical  Anesthesia Plan  ASA: III  Anesthesia Plan: General   Post-op Pain Management:    Induction: Intravenous  PONV Risk Score and Plan:   Airway Management Planned: Mask and Natural Airway  Additional Equipment:   Intra-op Plan:   Post-operative Plan:   Informed Consent: I have reviewed the patients History and Physical, chart, labs and discussed the procedure including the risks, benefits and alternatives for the proposed anesthesia with the patient or authorized representative who has indicated his/her understanding and acceptance.   Dental advisory given  Plan Discussed with: CRNA and Surgeon  Anesthesia Plan Comments: (Patient consented for risks of  anesthesia including but not limited to:  - adverse reactions to medications - risk of intubation if required - damage to teeth, lips or other oral mucosa - sore throat or hoarseness - Damage to heart, brain, lungs or loss of life  Patient voiced understanding.)        Anesthesia Quick Evaluation

## 2017-10-11 NOTE — Procedures (Signed)
ECT SERVICES Physician's Interval Evaluation & Treatment Note  Patient Identification: Ricky Williams MRN:  [id number] Date of Evaluation:  10/11/2017 TX #: 2  MADRS:   MMSE:   P.E. Findings:  No change to physical exam.  Vitals normal heart and lungs normal.  Psychiatric Interval Note:  Mood still subjectively depressed  Subjective:  Patient is a 22 y.o. male seen for evaluation for Electroconvulsive Therapy. Depressed and some muscle soreness  Treatment Summary:   [x]   Right Unilateral             []  Bilateral   % Energy : 0.3 ms 25%   Impedance: 1030 ohms  Seizure Energy Index: 30,003 V squared  Postictal Suppression Index: 97%  Seizure Concordance Index: 98%  Medications  Pre Shock: Toradol 30 mg Zofran 4 milligrams Brevital 80 mg succinylcholine 100 mg  Post Shock: Versed 2 mg  Seizure Duration: 46 seconds by EMG 76 seconds by EEG   Comments: Follow-up Friday  Lungs:  [x]   Clear to auscultation               []  Other:   Heart:    [x]   Regular rhythm             []  irregular rhythm    [x]   Previous H&P reviewed, patient examined and there are NO CHANGES                 []   Previous H&P reviewed, patient examined and there are changes noted.   Alethia Berthold, MD 2/6/201910:44 AM

## 2017-10-11 NOTE — Anesthesia Post-op Follow-up Note (Signed)
Anesthesia QCDR form completed.        

## 2017-10-11 NOTE — Discharge Instructions (Signed)
1)  The drugs that you have been given will stay in your system until tomorrow so for the       next 24 hours you should not:  A. Drive an automobile  B. Make any legal decisions  C. Drink any alcoholic beverages  2)  You may resume your regular meals upon return home.  3)  A responsible adult must take you home.  Someone should stay with you for a few          hours, then be available by phone for the remainder of the treatment day.  4)  You May experience any of the following symptoms:  Headache, Nausea and a dry mouth (due to the medications you were given),  temporary memory loss and some confusion, or sore muscles (a warm bath  should help this).  If you you experience any of these symptoms let us know on                your return visit.  5)  Report any of the following: any acute discomfort, severe headache, or temperature        greater than 100.5 F.   Also report any unusual redness, swelling, drainage, or pain         at your IV site.    You may report Symptoms to:  [place] at [facility]          Phone: [phone], ECT Department           or Dr. Prescott Gum office [phone]  6)  Your next ECT Treatment is Day Friday  Date October 13, 2017 at 845  We will call 2 days prior to your scheduled appointment for arrival times.  7)  Nothing to eat or drink after midnight the night before your procedure.  8)  Take .    With a sip of water the morning of your procedure.  9)  Other Instructions: Call [phone] to cancel the morning of your procedure due         to illness or emergency.  10) We will call within 72 hours to assess how you are feeling.

## 2017-10-11 NOTE — H&P (Signed)
Ricky Williams is an 22 y.o. male.   Chief Complaint: Patient had some soreness with neck pain.  Mood about the same. HPI: History of severe depression without consistent response to medicine  Past Medical History:  Diagnosis Date  . Allergy   . Anxiety   . Depression     History reviewed. No pertinent surgical history.  Family History  Problem Relation Age of Onset  . Breast cancer Mother   . Prostate cancer Father   . Kidney Stones Father   . Arthritis Father        Psoriatic   . Multiple myeloma Maternal Grandmother   . Melanoma Maternal Grandfather   . Breast cancer Paternal Grandmother   . Healthy Paternal Grandfather    Social History:  reports that  has never smoked. he has never used smokeless tobacco. He reports that he does not drink alcohol or use drugs.  Allergies: No Known Allergies   (Not in a hospital admission)  No results found for this or any previous visit (from the past 48 hour(s)). No results found.  Review of Systems  Constitutional: Negative.   HENT: Negative.   Eyes: Negative.   Respiratory: Negative.   Cardiovascular: Negative.   Gastrointestinal: Negative.   Musculoskeletal: Positive for myalgias and neck pain.  Skin: Negative.   Neurological: Negative.   Psychiatric/Behavioral: Positive for depression. Negative for hallucinations, memory loss, substance abuse and suicidal ideas. The patient is nervous/anxious. The patient does not have insomnia.     Blood pressure 100/61, pulse 74, temperature 97.8 F (36.6 C), temperature source Oral, resp. rate 12, height _0  (1.778 m), weight 58.5 kg (129 lb), SpO2 98 %. Physical Exam  Nursing note and vitals reviewed. Constitutional: He appears well-developed and well-nourished.  HENT:  Head: Normocephalic and atraumatic.  Eyes: Conjunctivae are normal. Pupils are equal, round, and reactive to light.  Neck: Normal range of motion.  Cardiovascular: Regular rhythm and normal heart sounds.   Respiratory: Effort normal. No respiratory distress.  GI: Soft.  Musculoskeletal: Normal range of motion.  Neurological: He is alert.  Skin: Skin is warm and dry.  Psychiatric: Judgment normal. His affect is blunt. His speech is delayed. He is slowed. Thought content is not paranoid. Cognition and memory are normal. He expresses no homicidal and no suicidal ideation.     Assessment/Plan Second ECT treatment today.  Some adjustments to medicine to improve comfort.  Alethia Berthold, MD 10/11/2017, 10:42 AM

## 2017-10-11 NOTE — Transfer of Care (Signed)
Immediate Anesthesia Transfer of Care Note  Patient: Ricky Williams  Procedure(s) Performed: ECT TX  Patient Location: PACU  Anesthesia Type:General  Level of Consciousness: sedated  Airway & Oxygen Therapy: Patient Spontanous Breathing and Patient connected to face mask oxygen  Post-op Assessment: Report given to RN and Post -op Vital signs reviewed and stable  Post vital signs: Reviewed and stable  Last Vitals:  Vitals:   10/11/17 0917 10/11/17 1106  BP: 100/61 104/60  Pulse: 74 71  Resp: 12 15  Temp: 36.6 C 36.6 C  SpO2: 98% 100%    Last Pain:  Vitals:   10/11/17 0917  TempSrc: Oral         Complications: No apparent anesthesia complications

## 2017-10-12 ENCOUNTER — Other Ambulatory Visit: Payer: Self-pay | Admitting: Psychiatry

## 2017-10-13 ENCOUNTER — Encounter: Payer: Self-pay | Admitting: Anesthesiology

## 2017-10-13 ENCOUNTER — Other Ambulatory Visit: Payer: Self-pay | Admitting: Psychiatry

## 2017-10-13 ENCOUNTER — Encounter (HOSPITAL_BASED_OUTPATIENT_CLINIC_OR_DEPARTMENT_OTHER)
Admission: RE | Admit: 2017-10-13 | Discharge: 2017-10-13 | Disposition: A | Discharge status: Home / Self Care | Payer: 59 | Source: Ambulatory Visit | Attending: Psychiatry | Admitting: Psychiatry

## 2017-10-13 DIAGNOSIS — F332 Major depressive disorder, recurrent severe without psychotic features: Secondary | ICD-10-CM | POA: Diagnosis not present

## 2017-10-13 DIAGNOSIS — F329 Major depressive disorder, single episode, unspecified: Secondary | ICD-10-CM | POA: Diagnosis not present

## 2017-10-13 MED ORDER — SUCCINYLCHOLINE CHLORIDE 200 MG/10ML IV SOSY
PREFILLED_SYRINGE | INTRAVENOUS | Status: DC | PRN
  Administered 2017-10-13: 100 mg via INTRAVENOUS

## 2017-10-13 MED ORDER — SODIUM CHLORIDE 0.9 % IV SOLN
500.0000 mL | Freq: Once | INTRAVENOUS | Status: AC
  Administered 2017-10-13: 500 mL via INTRAVENOUS

## 2017-10-13 MED ORDER — KETOROLAC TROMETHAMINE 30 MG/ML IJ SOLN
INTRAMUSCULAR | Status: AC
  Administered 2017-10-13: 30 mg via INTRAVENOUS
  Filled 2017-10-13: qty 1

## 2017-10-13 MED ORDER — SUCCINYLCHOLINE CHLORIDE 20 MG/ML IJ SOLN
INTRAMUSCULAR | Status: AC
  Filled 2017-10-13: qty 2

## 2017-10-13 MED ORDER — ONDANSETRON HCL 4 MG/2ML IJ SOLN
INTRAMUSCULAR | Status: AC
  Administered 2017-10-13: 4 mg via INTRAVENOUS
  Filled 2017-10-13: qty 2

## 2017-10-13 MED ORDER — MIDAZOLAM HCL 2 MG/2ML IJ SOLN: 1.0000 mg | Freq: Once | INTRAMUSCULAR | Status: DC

## 2017-10-13 MED ORDER — MIDAZOLAM HCL 2 MG/2ML IJ SOLN
INTRAMUSCULAR | Status: AC
  Filled 2017-10-13: qty 2

## 2017-10-13 MED ORDER — SODIUM CHLORIDE 0.9 % IV SOLN
INTRAVENOUS | Status: DC | PRN
  Administered 2017-10-13: 10:00:00 via INTRAVENOUS

## 2017-10-13 MED ORDER — KETOROLAC TROMETHAMINE 30 MG/ML IJ SOLN
30.0000 mg | Freq: Once | INTRAMUSCULAR | Status: AC
  Administered 2017-10-13: 30 mg via INTRAVENOUS

## 2017-10-13 MED ORDER — METHOHEXITAL SODIUM 100 MG/10ML IV SOSY
PREFILLED_SYRINGE | INTRAVENOUS | Status: DC | PRN
  Administered 2017-10-13: 80 mg via INTRAVENOUS

## 2017-10-13 MED ORDER — ONDANSETRON HCL 4 MG/2ML IJ SOLN
4.0000 mg | Freq: Once | INTRAMUSCULAR | Status: AC
  Administered 2017-10-13: 4 mg via INTRAVENOUS

## 2017-10-13 NOTE — Anesthesia Postprocedure Evaluation (Signed)
Anesthesia Post Note  Patient: Ricky Williams  Procedure(s) Performed: ECT TX  Patient location during evaluation: PACU Anesthesia Type: General Level of consciousness: awake and alert Pain management: pain level controlled Vital Signs Assessment: post-procedure vital signs reviewed and stable Respiratory status: spontaneous breathing, nonlabored ventilation, respiratory function stable and patient connected to nasal cannula oxygen Cardiovascular status: blood pressure returned to baseline and stable Postop Assessment: no apparent nausea or vomiting Anesthetic complications: no     Last Vitals:  Vitals:   10/13/17 1110 10/13/17 1121  BP: 110/71   Pulse: 81 76  Resp: 20 16  Temp: 36.8 C 37 C  SpO2: 98%     Last Pain:  Vitals:   10/13/17 1121  TempSrc: Oral  PainSc:                  Precious Haws Piscitello

## 2017-10-13 NOTE — Anesthesia Post-op Follow-up Note (Signed)
Anesthesia QCDR form completed.        

## 2017-10-13 NOTE — Anesthesia Preprocedure Evaluation (Signed)
Anesthesia Evaluation  Patient identified by MRN, date of birth, ID band Patient awake    Reviewed: Allergy & Precautions, NPO status , Patient's Chart, lab work & pertinent test results  History of Anesthesia Complications Negative for: history of anesthetic complications  Airway Mallampati: II  TM Distance: >3 FB     Dental  (+) Teeth Intact   Pulmonary neg pulmonary ROS, neg shortness of breath,    Pulmonary exam normal        Cardiovascular Exercise Tolerance: Good negative cardio ROS Normal cardiovascular exam     Neuro/Psych PSYCHIATRIC DISORDERS Anxiety Depression negative neurological ROS     GI/Hepatic negative GI ROS, Neg liver ROS,   Endo/Other  negative endocrine ROS  Renal/GU negative Renal ROS  negative genitourinary   Musculoskeletal negative musculoskeletal ROS (+)   Abdominal Normal abdominal exam  (+)   Peds negative pediatric ROS (+)  Hematology negative hematology ROS (+)   Anesthesia Other Findings Past Medical History: No date: Allergy No date: Anxiety No date: Depression  History reviewed. No pertinent surgical history.   Reproductive/Obstetrics                             Anesthesia Physical  Anesthesia Plan  ASA: III  Anesthesia Plan: General   Post-op Pain Management:    Induction: Intravenous  PONV Risk Score and Plan:   Airway Management Planned: Mask and Natural Airway  Additional Equipment:   Intra-op Plan:   Post-operative Plan:   Informed Consent: I have reviewed the patients History and Physical, chart, labs and discussed the procedure including the risks, benefits and alternatives for the proposed anesthesia with the patient or authorized representative who has indicated his/her understanding and acceptance.   Dental advisory given  Plan Discussed with: CRNA and Surgeon  Anesthesia Plan Comments: (Patient consented for risks of  anesthesia including but not limited to:  - adverse reactions to medications - risk of intubation if required - damage to teeth, lips or other oral mucosa - sore throat or hoarseness - Damage to heart, brain, lungs or loss of life  Patient voiced understanding.)        Anesthesia Quick Evaluation

## 2017-10-13 NOTE — Anesthesia Procedure Notes (Signed)
Date/Time: 10/13/2017 10:26 AM Performed by: Dionne Bucy, CRNA Pre-anesthesia Checklist: Patient identified, Emergency Drugs available, Suction available and Patient being monitored Patient Re-evaluated:Patient Re-evaluated prior to induction Oxygen Delivery Method: Circle system utilized Preoxygenation: Pre-oxygenation with 100% oxygen Induction Type: IV induction Ventilation: Mask ventilation without difficulty and Mask ventilation throughout procedure Airway Equipment and Method: Bite block Placement Confirmation: positive ETCO2 Dental Injury: Teeth and Oropharynx as per pre-operative assessment

## 2017-10-13 NOTE — Transfer of Care (Signed)
Immediate Anesthesia Transfer of Care Note  Patient: Ricky Williams  Procedure(s) Performed: ECT TX  Patient Location: PACU  Anesthesia Type:General  Level of Consciousness: sedated  Airway & Oxygen Therapy: Patient Spontanous Breathing and Patient connected to nasal cannula oxygen  Post-op Assessment: Report given to RN and Post -op Vital signs reviewed and stable  Post vital signs: Reviewed and stable  Last Vitals:  Vitals:   10/13/17 1110 10/13/17 1121  BP: 110/71   Pulse: 81 76  Resp: 20 16  Temp: 36.8 C 37 C  SpO2: 98%     Last Pain:  Vitals:   10/13/17 1121  TempSrc: Oral  PainSc:          Complications: No apparent anesthesia complications

## 2017-10-13 NOTE — Discharge Instructions (Signed)
1)  The drugs that you have been given will stay in your system until tomorrow so for the       next 24 hours you should not:  A. Drive an automobile  B. Make any legal decisions  C. Drink any alcoholic beverages  2)  You may resume your regular meals upon return home.  3)  A responsible adult must take you home.  Someone should stay with you for a few          hours, then be available by phone for the remainder of the treatment day.  4)  You May experience any of the following symptoms:  Headache, Nausea and a dry mouth (due to the medications you were given),  temporary memory loss and some confusion, or sore muscles (a warm bath  should help this).  If you you experience any of these symptoms let us know on                your return visit.  5)  Report any of the following: any acute discomfort, severe headache, or temperature        greater than 100.5 F.   Also report any unusual redness, swelling, drainage, or pain         at your IV site.    You may report Symptoms to:  [place] at [facility]          Phone: [phone], ECT Department           or Dr. Prescott Gum office [phone]  6)  Your next ECT Treatment is Monday February 11 at 9:15  We will call 2 days prior to your scheduled appointment for arrival times.  7)  Nothing to eat or drink after midnight the night before your procedure.  8)  Take     With a sip of water the morning of your procedure.  9)  Other Instructions: Call [phone] to cancel the morning of your procedure due         to illness or emergency.  10) We will call within 72 hours to assess how you are feeling.

## 2017-10-13 NOTE — Procedures (Signed)
ECT SERVICES Physician's Interval Evaluation & Treatment Note  Patient Identification: Ricky Williams MRN:  [id number] Date of Evaluation:  10/13/2017 TX #: 3  MADRS:   MMSE:   P.E. Findings:  No change to physical exam heart and lungs normal vitals fine.  Psychiatric Interval Note:  Continues to be depressed no change to mood.  Subjective:  Patient is a 22 y.o. male seen for evaluation for Electroconvulsive Therapy. Muscle aches and pains much improved no change to psychiatric complaint  Treatment Summary:   [x]   Right Unilateral             []  Bilateral   % Energy : 0.3 ms 25%   Impedance: 850 ohms  Seizure Energy Index: 22,949 V squared  Postictal Suppression Index: 96%  Seizure Concordance Index: 98%  Medications  Pre Shock: Toradol 30 mg Zofran 4 mg Brevital 80 mg succinylcholine 100 mg  Post Shock: Versed 1 mg  Seizure Duration: 38 seconds by EMG 104 seconds by EEG   Comments: We will continue 3 times a week treatment into next week Monday Wednesday Friday patient has no concerns or complaints at this point.  Lungs:  [x]   Clear to auscultation               []  Other:   Heart:    [x]   Regular rhythm             []  irregular rhythm    [x]   Previous H&P reviewed, patient examined and there are NO CHANGES                 []   Previous H&P reviewed, patient examined and there are changes noted.   Alethia Berthold, MD 2/8/201910:17 AM

## 2017-10-13 NOTE — H&P (Signed)
Ricky Williams is an 22 y.o. male.   Chief Complaint: Continues to be depressed not much change.  The muscle soreness is improved HPI: History of severe ongoing depression without consistent response to medicine  Past Medical History:  Diagnosis Date  . Allergy   . Anxiety   . Depression     History reviewed. No pertinent surgical history.  Family History  Problem Relation Age of Onset  . Breast cancer Mother   . Prostate cancer Father   . Kidney Stones Father   . Arthritis Father        Psoriatic   . Multiple myeloma Maternal Grandmother   . Melanoma Maternal Grandfather   . Breast cancer Paternal Grandmother   . Healthy Paternal Grandfather    Social History:  reports that  has never smoked. he has never used smokeless tobacco. He reports that he does not drink alcohol or use drugs.  Allergies: No Known Allergies   (Not in a hospital admission)  No results found for this or any previous visit (from the past 48 hour(s)). No results found.  Review of Systems  Constitutional: Negative.   HENT: Negative.   Eyes: Negative.   Respiratory: Negative.   Cardiovascular: Negative.   Gastrointestinal: Negative.   Musculoskeletal: Negative.   Skin: Negative.   Neurological: Negative.   Psychiatric/Behavioral: Positive for depression. Negative for hallucinations, memory loss, substance abuse and suicidal ideas. The patient is not nervous/anxious and does not have insomnia.     Blood pressure 113/67, pulse 74, temperature 98.4 F (36.9 C), temperature source Oral, height _0  (1.778 m), weight 57.6 kg (127 lb), SpO2 99 %. Physical Exam  Nursing note and vitals reviewed. Constitutional: He appears well-developed and well-nourished.  HENT:  Head: Normocephalic and atraumatic.  Eyes: Conjunctivae are normal. Pupils are equal, round, and reactive to light.  Neck: Normal range of motion.  Cardiovascular: Regular rhythm and normal heart sounds.  Respiratory: Effort normal.  No respiratory distress.  GI: Soft.  Musculoskeletal: Normal range of motion.  Neurological: He is alert.  Skin: Skin is warm and dry.  Psychiatric: Judgment normal. His affect is blunt. His speech is delayed. He is slowed. Cognition and memory are normal. He exhibits a depressed mood. He expresses no homicidal and no suicidal ideation.     Assessment/Plan We are continuing 3 times a week ECT  Alethia Berthold, MD 10/13/2017, 10:16 AM

## 2017-10-16 ENCOUNTER — Encounter: Payer: Self-pay | Admitting: Anesthesiology

## 2017-10-16 ENCOUNTER — Encounter
Admission: RE | Admit: 2017-10-16 | Discharge: 2017-10-16 | Disposition: A | Discharge status: Home / Self Care | Payer: 59 | Source: Ambulatory Visit | Attending: Psychiatry | Admitting: Psychiatry

## 2017-10-16 DIAGNOSIS — Z803 Family history of malignant neoplasm of breast: Secondary | ICD-10-CM | POA: Diagnosis not present

## 2017-10-16 DIAGNOSIS — Z8042 Family history of malignant neoplasm of prostate: Secondary | ICD-10-CM | POA: Diagnosis not present

## 2017-10-16 DIAGNOSIS — Z841 Family history of disorders of kidney and ureter: Secondary | ICD-10-CM | POA: Insufficient documentation

## 2017-10-16 DIAGNOSIS — Z808 Family history of malignant neoplasm of other organs or systems: Secondary | ICD-10-CM | POA: Insufficient documentation

## 2017-10-16 DIAGNOSIS — Z807 Family history of other malignant neoplasms of lymphoid, hematopoietic and related tissues: Secondary | ICD-10-CM | POA: Insufficient documentation

## 2017-10-16 DIAGNOSIS — F332 Major depressive disorder, recurrent severe without psychotic features: Secondary | ICD-10-CM | POA: Diagnosis not present

## 2017-10-16 DIAGNOSIS — Z8261 Family history of arthritis: Secondary | ICD-10-CM | POA: Insufficient documentation

## 2017-10-16 MED ORDER — SUCCINYLCHOLINE CHLORIDE 20 MG/ML IJ SOLN
INTRAMUSCULAR | Status: DC | PRN
  Administered 2017-10-16: 80 mg via INTRAVENOUS

## 2017-10-16 MED ORDER — ROCURONIUM BROMIDE 50 MG/5ML IV SOLN
INTRAVENOUS | Status: AC
  Filled 2017-10-16: qty 3

## 2017-10-16 MED ORDER — SODIUM CHLORIDE 0.9 % IV SOLN
INTRAVENOUS | Status: DC | PRN
  Administered 2017-10-16: 11:00:00 via INTRAVENOUS

## 2017-10-16 MED ORDER — KETOROLAC TROMETHAMINE 30 MG/ML IJ SOLN
INTRAMUSCULAR | Status: AC
  Filled 2017-10-16: qty 1

## 2017-10-16 MED ORDER — METHOHEXITAL SODIUM 0.5 G IJ SOLR
INTRAMUSCULAR | Status: AC
  Filled 2017-10-16: qty 500

## 2017-10-16 MED ORDER — ONDANSETRON HCL 4 MG/2ML IJ SOLN
INTRAMUSCULAR | Status: AC
  Filled 2017-10-16: qty 2

## 2017-10-16 MED ORDER — ONDANSETRON HCL 4 MG/2ML IJ SOLN
4.0000 mg | Freq: Once | INTRAMUSCULAR | Status: AC
  Administered 2017-10-16: 4 mg via INTRAVENOUS

## 2017-10-16 MED ORDER — SUCCINYLCHOLINE CHLORIDE 20 MG/ML IJ SOLN
INTRAMUSCULAR | Status: AC
  Filled 2017-10-16: qty 1

## 2017-10-16 MED ORDER — METHOHEXITAL SODIUM 100 MG/10ML IV SOSY
PREFILLED_SYRINGE | INTRAVENOUS | Status: DC | PRN
  Administered 2017-10-16: 80 mg via INTRAVENOUS

## 2017-10-16 MED ORDER — MIDAZOLAM HCL 2 MG/2ML IJ SOLN
1.0000 mg | Freq: Once | INTRAMUSCULAR | Status: AC
  Administered 2017-10-16: 2 mg via INTRAVENOUS

## 2017-10-16 MED ORDER — SODIUM CHLORIDE 0.9 % IV SOLN
500.0000 mL | Freq: Once | INTRAVENOUS | Status: AC
  Administered 2017-10-16: 500 mL via INTRAVENOUS

## 2017-10-16 MED ORDER — MIDAZOLAM HCL 2 MG/2ML IJ SOLN
INTRAMUSCULAR | Status: AC
  Filled 2017-10-16: qty 2

## 2017-10-16 MED ORDER — KETOROLAC TROMETHAMINE 30 MG/ML IJ SOLN
30.0000 mg | Freq: Once | INTRAMUSCULAR | Status: AC
  Administered 2017-10-16: 30 mg via INTRAVENOUS

## 2017-10-16 NOTE — H&P (Signed)
Ricky Williams is an 21 y.o. male.   Chief Complaint: Continues to feel depressed no significant change.  Low energy low motivation history of severe depression so far without improvement HPI: History of severe depression without improvement on medication and therapy  Past Medical History:  Diagnosis Date  . Allergy   . Anxiety   . Depression     History reviewed. No pertinent surgical history.  Family History  Problem Relation Age of Onset  . Breast cancer Mother   . Prostate cancer Father   . Kidney Stones Father   . Arthritis Father        Psoriatic   . Multiple myeloma Maternal Grandmother   . Melanoma Maternal Grandfather   . Breast cancer Paternal Grandmother   . Healthy Paternal Grandfather    Social History:  reports that  has never smoked. he has never used smokeless tobacco. He reports that he does not drink alcohol or use drugs.  Allergies: No Known Allergies   (Not in a hospital admission)  No results found for this or any previous visit (from the past 48 hour(s)). No results found.  Review of Systems  Constitutional: Negative.   HENT: Negative.   Eyes: Negative.   Respiratory: Negative.   Cardiovascular: Negative.   Gastrointestinal: Negative.   Musculoskeletal: Negative.   Skin: Negative.   Neurological: Negative.   Psychiatric/Behavioral: Positive for depression. Negative for hallucinations, memory loss, substance abuse and suicidal ideas. The patient is not nervous/anxious and does not have insomnia.     Blood pressure 112/62, pulse 70, temperature 97.8 F (36.6 C), resp. rate (!) 6, height 5' 10" (1.778 m), weight 57.6 kg (127 lb), SpO2 99 %. Physical Exam  Nursing note reviewed. Constitutional: He appears well-developed and well-nourished.  HENT:  Head: Normocephalic and atraumatic.  Eyes: Conjunctivae are normal. Pupils are equal, round, and reactive to light.  Neck: Normal range of motion.  Cardiovascular: Regular rhythm and normal heart  sounds.  Respiratory: Effort normal.  GI: Soft.  Musculoskeletal: Normal range of motion.  Neurological: He is alert.  Skin: Skin is warm and dry.  Psychiatric: Judgment normal. His affect is blunt. His speech is delayed. He is slowed. Thought content is not paranoid. He expresses no suicidal ideation.     Assessment/Plan Today is treatment #4.  No improvement so far.  Increasing strength to 30%.  John Clapacs, MD 10/16/2017, 10:42 AM   

## 2017-10-16 NOTE — Anesthesia Post-op Follow-up Note (Signed)
Anesthesia QCDR form completed.        

## 2017-10-16 NOTE — Anesthesia Preprocedure Evaluation (Signed)
Anesthesia Evaluation  Patient identified by MRN, date of birth, ID band Patient awake    Reviewed: Allergy & Precautions, H&P , NPO status , Patient's Chart, lab work & pertinent test results, reviewed documented beta blocker date and time   Airway Mallampati: II   Neck ROM: full    Dental  (+) Poor Dentition   Pulmonary neg pulmonary ROS,    Pulmonary exam normal        Cardiovascular negative cardio ROS Normal cardiovascular exam Rhythm:regular Rate:Normal     Neuro/Psych PSYCHIATRIC DISORDERS negative neurological ROS  negative psych ROS   GI/Hepatic negative GI ROS, Neg liver ROS,   Endo/Other  negative endocrine ROS  Renal/GU negative Renal ROS  negative genitourinary   Musculoskeletal   Abdominal   Peds  Hematology negative hematology ROS (+)   Anesthesia Other Findings Past Medical History: No date: Allergy No date: Anxiety No date: Depression History reviewed. No pertinent surgical history. BMI    Body Mass Index:  18.22 kg/m     Reproductive/Obstetrics negative OB ROS                             Anesthesia Physical Anesthesia Plan  ASA: II  Anesthesia Plan: General   Post-op Pain Management:    Induction:   PONV Risk Score and Plan:   Airway Management Planned:   Additional Equipment:   Intra-op Plan:   Post-operative Plan:   Informed Consent: I have reviewed the patients History and Physical, chart, labs and discussed the procedure including the risks, benefits and alternatives for the proposed anesthesia with the patient or authorized representative who has indicated his/her understanding and acceptance.   Dental Advisory Given  Plan Discussed with: CRNA  Anesthesia Plan Comments:         Anesthesia Quick Evaluation

## 2017-10-16 NOTE — Discharge Instructions (Signed)
1)  The drugs that you have been given will stay in your system until tomorrow so for the       next 24 hours you should not:  A. Drive an automobile  B. Make any legal decisions  C. Drink any alcoholic beverages  2)  You may resume your regular meals upon return home.  3)  A responsible adult must take you home.  Someone should stay with you for a few          hours, then be available by phone for the remainder of the treatment day.  4)  You May experience any of the following symptoms:  Headache, Nausea and a dry mouth (due to the medications you were given),  temporary memory loss and some confusion, or sore muscles (a warm bath  should help this).  If you you experience any of these symptoms let us know on                your return visit.  5)  Report any of the following: any acute discomfort, severe headache, or temperature        greater than 100.5 F.   Also report any unusual redness, swelling, drainage, or pain         at your IV site.    You may report Symptoms to:  [place] at [facility]          Phone: [phone], ECT Department           or Dr. Prescott Gum office [phone]  6)  Your next ECT Treatment is Day Wednesday  Date February 13th at [address] [facility]  We will call 2 days prior to your scheduled appointment for arrival times.  7)  Nothing to eat or drink after midnight the night before your procedure.  8)  Take      With a sip of water the morning of your procedure.  9)  Other Instructions: Call [phone] to cancel the morning of your procedure due         to illness or emergency.  10) We will call within 72 hours to assess how you are feeling.

## 2017-10-16 NOTE — Procedures (Signed)
ECT SERVICES Physician's Interval Evaluation & Treatment Note  Patient Identification: Ricky Williams MRN:  [id number] Date of Evaluation:  10/16/2017 TX #: 4  MADRS: 27  MMSE: 30  P.E. Findings:  No change to physical exam  Psychiatric Interval Note:  Continues to feel down and depressed  Subjective:  Patient is a 22 y.o. male seen for evaluation for Electroconvulsive Therapy. Depressed mood no change to memory however  Treatment Summary:   [x]   Right Unilateral             []  Bilateral   % Energy : 0.3 ms 30%   Impedance: 1420 ohms   Seizure Energy Index: 26,122 V squared  Postictal Suppression Index: 95%  Seizure Concordance Index: 95%  Medications  Pre Shock: Toradol 30 mg Zofran 4 mg Brevital 80 mg succinylcholine 100 mg  Post Shock: Versed 1 mg  Seizure Duration: 31 seconds by EMG 82 seconds by EEG   Comments: Follow-up Wednesday  Lungs:  [x]   Clear to auscultation               []  Other:   Heart:    [x]   Regular rhythm             []  irregular rhythm    [x]   Previous H&P reviewed, patient examined and there are NO CHANGES                 []   Previous H&P reviewed, patient examined and there are changes noted.   Alethia Berthold, MD 2/11/201910:45 AM

## 2017-10-16 NOTE — Transfer of Care (Signed)
Immediate Anesthesia Transfer of Care Note  Patient: Ricky Williams  Procedure(s) Performed: ECT TX  Patient Location: PACU  Anesthesia Type:General  Level of Consciousness: sedated and responds to stimulation  Airway & Oxygen Therapy: Patient Spontanous Breathing and Patient connected to face mask oxygen  Post-op Assessment: Report given to RN and Post -op Vital signs reviewed and stable  Post vital signs: Reviewed and stable  Last Vitals:  Vitals:   10/16/17 0948 10/16/17 1103  BP: 112/62 101/62  Pulse: 70 67  Resp: (!) 6 (!) 21  Temp: 36.6 C 36.6 C  SpO2: 99% 100%    Last Pain: There were no vitals filed for this visit.       Complications: No apparent anesthesia complications

## 2017-10-17 ENCOUNTER — Other Ambulatory Visit: Payer: Self-pay | Admitting: Psychiatry

## 2017-10-17 NOTE — Anesthesia Postprocedure Evaluation (Signed)
Anesthesia Post Note  Patient: Ricky Williams  Procedure(s) Performed: ECT TX  Patient location during evaluation: PACU Anesthesia Type: General Level of consciousness: awake and alert Pain management: pain level controlled Vital Signs Assessment: post-procedure vital signs reviewed and stable Respiratory status: spontaneous breathing, nonlabored ventilation, respiratory function stable and patient connected to nasal cannula oxygen Cardiovascular status: blood pressure returned to baseline and stable Postop Assessment: no apparent nausea or vomiting Anesthetic complications: no     Last Vitals:  Vitals:   10/16/17 1133 10/16/17 1153  BP: (!) 102/57 109/67  Pulse: 71 72  Resp: 16 16  Temp:  36.6 C  SpO2: 98%     Last Pain:  Vitals:   10/16/17 1153  TempSrc: Oral  PainSc: 0-No pain                 Molli Barrows

## 2017-10-18 ENCOUNTER — Encounter: Payer: Self-pay | Admitting: Anesthesiology

## 2017-10-18 ENCOUNTER — Encounter
Admission: RE | Admit: 2017-10-18 | Discharge: 2017-10-18 | Disposition: A | Discharge status: Home / Self Care | Payer: 59 | Source: Ambulatory Visit | Attending: Psychiatry | Admitting: Psychiatry

## 2017-10-18 ENCOUNTER — Ambulatory Visit: Payer: 59 | Admitting: Psychology

## 2017-10-18 DIAGNOSIS — F419 Anxiety disorder, unspecified: Secondary | ICD-10-CM | POA: Diagnosis not present

## 2017-10-18 DIAGNOSIS — F329 Major depressive disorder, single episode, unspecified: Secondary | ICD-10-CM | POA: Insufficient documentation

## 2017-10-18 DIAGNOSIS — F332 Major depressive disorder, recurrent severe without psychotic features: Secondary | ICD-10-CM

## 2017-10-18 MED ORDER — ONDANSETRON HCL 4 MG/2ML IJ SOLN
INTRAMUSCULAR | Status: AC
  Filled 2017-10-18: qty 2

## 2017-10-18 MED ORDER — KETOROLAC TROMETHAMINE 30 MG/ML IJ SOLN
30.0000 mg | Freq: Once | INTRAMUSCULAR | Status: AC
  Administered 2017-10-18: 30 mg via INTRAVENOUS

## 2017-10-18 MED ORDER — SUCCINYLCHOLINE CHLORIDE 20 MG/ML IJ SOLN
INTRAMUSCULAR | Status: DC | PRN
  Administered 2017-10-18: 80 mg via INTRAVENOUS

## 2017-10-18 MED ORDER — MIDAZOLAM HCL 2 MG/2ML IJ SOLN
INTRAMUSCULAR | Status: DC | PRN
  Administered 2017-10-18: 2 mg via INTRAVENOUS

## 2017-10-18 MED ORDER — KETOROLAC TROMETHAMINE 30 MG/ML IJ SOLN
INTRAMUSCULAR | Status: AC
  Filled 2017-10-18: qty 1

## 2017-10-18 MED ORDER — MIDAZOLAM HCL 2 MG/2ML IJ SOLN
INTRAMUSCULAR | Status: AC
  Filled 2017-10-18: qty 2

## 2017-10-18 MED ORDER — SODIUM CHLORIDE 0.9 % IV SOLN
500.0000 mL | Freq: Once | INTRAVENOUS | Status: AC
  Administered 2017-10-18: 500 mL via INTRAVENOUS

## 2017-10-18 MED ORDER — ONDANSETRON HCL 4 MG/2ML IJ SOLN
4.0000 mg | Freq: Once | INTRAMUSCULAR | Status: AC
  Administered 2017-10-18: 4 mg via INTRAVENOUS

## 2017-10-18 MED ORDER — SUCCINYLCHOLINE CHLORIDE 20 MG/ML IJ SOLN
INTRAMUSCULAR | Status: AC
  Filled 2017-10-18: qty 1

## 2017-10-18 MED ORDER — METHOHEXITAL SODIUM 100 MG/10ML IV SOSY
PREFILLED_SYRINGE | INTRAVENOUS | Status: DC | PRN
  Administered 2017-10-18: 80 mg via INTRAVENOUS

## 2017-10-18 MED ORDER — SODIUM CHLORIDE 0.9 % IV SOLN
INTRAVENOUS | Status: DC | PRN
  Administered 2017-10-18: 11:00:00 via INTRAVENOUS

## 2017-10-18 MED ORDER — MIDAZOLAM HCL 2 MG/2ML IJ SOLN: 1.0000 mg | Freq: Once | INTRAMUSCULAR | Status: DC

## 2017-10-18 NOTE — Transfer of Care (Signed)
Immediate Anesthesia Transfer of Care Note  Patient: Ricky Williams  Procedure(s) Performed: ECT TX  Patient Location: Short Stay  Anesthesia Type:General  Level of Consciousness: sedated  Airway & Oxygen Therapy: Patient connected to face mask oxygen  Post-op Assessment: Post -op Vital signs reviewed and stable  Post vital signs: stable  Last Vitals:  Vitals:   10/18/17 0936 10/18/17 1114  BP: 115/63 (!) (P) 101/51  Pulse: 73 (P) 75  Resp: 16 (P) 16  Temp: 36.9 C (P) 36.4 C  SpO2: 100% (P) 100%    Last Pain:  Vitals:   10/18/17 0936  TempSrc: Oral  PainSc: 0-No pain         Complications: No apparent anesthesia complications

## 2017-10-18 NOTE — Anesthesia Post-op Follow-up Note (Signed)
Anesthesia QCDR form completed.        

## 2017-10-18 NOTE — Anesthesia Preprocedure Evaluation (Signed)
Anesthesia Evaluation  Patient identified by MRN, date of birth, ID band Patient awake    Reviewed: Allergy & Precautions, NPO status , Patient's Chart, lab work & pertinent test results  History of Anesthesia Complications Negative for: history of anesthetic complications  Airway Mallampati: II  TM Distance: >3 FB Neck ROM: Full    Dental no notable dental hx.    Pulmonary neg pulmonary ROS, neg sleep apnea, neg COPD,    breath sounds clear to auscultation- rhonchi (-) wheezing      Cardiovascular Exercise Tolerance: Good (-) hypertension(-) CAD and (-) Past MI  Rhythm:Regular Rate:Normal - Systolic murmurs and - Diastolic murmurs    Neuro/Psych PSYCHIATRIC DISORDERS Anxiety Depression negative neurological ROS     GI/Hepatic negative GI ROS, Neg liver ROS,   Endo/Other  negative endocrine ROSneg diabetes  Renal/GU negative Renal ROS     Musculoskeletal negative musculoskeletal ROS (+)   Abdominal (+) - obese,   Peds  Hematology negative hematology ROS (+)   Anesthesia Other Findings Past Medical History: No date: Allergy No date: Anxiety No date: Depression   Reproductive/Obstetrics                             Anesthesia Physical Anesthesia Plan  ASA: II  Anesthesia Plan: General   Post-op Pain Management:    Induction: Intravenous  PONV Risk Score and Plan: 1 and Ondansetron  Airway Management Planned: Mask  Additional Equipment:   Intra-op Plan:   Post-operative Plan:   Informed Consent: I have reviewed the patients History and Physical, chart, labs and discussed the procedure including the risks, benefits and alternatives for the proposed anesthesia with the patient or authorized representative who has indicated his/her understanding and acceptance.   Dental advisory given  Plan Discussed with: CRNA and Anesthesiologist  Anesthesia Plan Comments:          Anesthesia Quick Evaluation

## 2017-10-18 NOTE — H&P (Signed)
Ricky Williams is an 22 y.o. male.   Chief Complaint: Patient continues to report depressed mood no real change. HPI: History of long-standing depression without significant improvement with medication and therapy currently having first index course of ECT  Past Medical History:  Diagnosis Date  . Allergy   . Anxiety   . Depression     History reviewed. No pertinent surgical history.  Family History  Problem Relation Age of Onset  . Breast cancer Mother   . Prostate cancer Father   . Kidney Stones Father   . Arthritis Father        Psoriatic   . Multiple myeloma Maternal Grandmother   . Melanoma Maternal Grandfather   . Breast cancer Paternal Grandmother   . Healthy Paternal Grandfather    Social History:  reports that  has never smoked. he has never used smokeless tobacco. He reports that he does not drink alcohol or use drugs.  Allergies: No Known Allergies   (Not in a hospital admission)  No results found for this or any previous visit (from the past 48 hour(s)). No results found.  Review of Systems  Constitutional: Negative.   HENT: Negative.   Eyes: Negative.   Respiratory: Negative.   Cardiovascular: Negative.   Gastrointestinal: Negative.   Musculoskeletal: Negative.   Skin: Negative.   Neurological: Negative.   Psychiatric/Behavioral: Positive for depression. Negative for hallucinations, memory loss, substance abuse and suicidal ideas. The patient is nervous/anxious and has insomnia.     Blood pressure 115/63, pulse 73, temperature 98.4 F (36.9 C), temperature source Oral, resp. rate 16, height _0  (1.778 m), weight 57.6 kg (127 lb), SpO2 100 %. Physical Exam  Nursing note and vitals reviewed. Constitutional: He appears well-developed and well-nourished.  HENT:  Head: Normocephalic and atraumatic.  Eyes: Conjunctivae are normal. Pupils are equal, round, and reactive to light.  Neck: Normal range of motion.  Cardiovascular: Regular rhythm and  normal heart sounds.  Respiratory: Effort normal. No respiratory distress.  GI: Soft.  Musculoskeletal: Normal range of motion.  Neurological: He is alert.  Skin: Skin is warm and dry.  Psychiatric: His mood appears anxious. His affect is blunt. His speech is delayed. He is slowed. Cognition and memory are impaired. He expresses impulsivity. He exhibits a depressed mood. He expresses no suicidal ideation.     Assessment/Plan Follow-up Friday.  Patient has not shown any improvement today.  If we continue to see no improvement this week we will discuss the option of switching to bilateral treatment.  Alethia Berthold, MD 10/18/2017, 10:55 AM

## 2017-10-18 NOTE — Discharge Instructions (Signed)
1)  The drugs that you have been given will stay in your system until tomorrow so for the       next 24 hours you should not:  A. Drive an automobile  B. Make any legal decisions  C. Drink any alcoholic beverages  2)  You may resume your regular meals upon return home.  3)  A responsible adult must take you home.  Someone should stay with you for a few          hours, then be available by phone for the remainder of the treatment day.  4)  You May experience any of the following symptoms:  Headache, Nausea and a dry mouth (due to the medications you were given),  temporary memory loss and some confusion, or sore muscles (a warm bath  should help this).  If you you experience any of these symptoms let us know on                your return visit.  5)  Report any of the following: any acute discomfort, severe headache, or temperature        greater than 100.5 F.   Also report any unusual redness, swelling, drainage, or pain         at your IV site.    You may report Symptoms to:  [place] [facility]          Phone: [phone], ECT Department           or Dr. Prescott Gum office [phone]  6)  Your next ECT Treatment is Friday October 21 843  We will call 2 days prior to your scheduled appointment for arrival times.  7)  Nothing to eat or drink after midnight the night before your procedure.  8)  Take     With a sip of water the morning of your procedure.  9)  Other Instructions: Call [phone] to cancel the morning of your procedure due         to illness or emergency.  10) We will call within 72 hours to assess how you are feeling.

## 2017-10-18 NOTE — Procedures (Signed)
ECT SERVICES Physician's Interval Evaluation & Treatment Note  Patient Identification: Ricky Williams MRN:  [id number] Date of Evaluation:  10/18/2017 TX #: [id number]  MADRS:   MMSE:   P.E. Findings:  No change to physical exam  Psychiatric Interval Note:  Reports that mood is not changed.  Still feels depressed has no energy has no motivation does very little  Subjective:  Patient is a 22 y.o. male seen for evaluation for Electroconvulsive Therapy. Still feeling depressed does not feel any change also does not complain of any side effects.  Treatment Summary:   [x]   Right Unilateral             []  Bilateral   % Energy : 0.3 ms 30%   Impedance: 1330 ohms  Seizure Energy Index: 18,513 V squared  Postictal Suppression Index: 96%  Seizure Concordance Index: 97%  Medications  Pre Shock: Toradol 30 mg Zofran 4 mg Brevital 80 mg succinylcholine 100 mg  Post Shock: Versed 2 mg  Seizure Duration: 31 seconds by EMG 76 seconds by EEG   Comments: Follow-up Friday and reassess if no improvement consider either winding down treatment or switching to bilateral.  Lungs:  [x]   Clear to auscultation               []  Other:   Heart:    [x]   Regular rhythm             []  irregular rhythm    [x]   Previous H&P reviewed, patient examined and there are NO CHANGES                 []   Previous H&P reviewed, patient examined and there are changes noted.   Alethia Berthold, MD 2/13/201910:57 AM

## 2017-10-18 NOTE — Anesthesia Postprocedure Evaluation (Signed)
Anesthesia Post Note  Patient: Ricky Williams  Procedure(s) Performed: ECT TX  Patient location during evaluation: PACU Anesthesia Type: General Level of consciousness: awake and alert Pain management: pain level controlled Vital Signs Assessment: post-procedure vital signs reviewed and stable Respiratory status: spontaneous breathing, nonlabored ventilation and respiratory function stable Cardiovascular status: blood pressure returned to baseline and stable Postop Assessment: no signs of nausea or vomiting Anesthetic complications: no     Last Vitals:  Vitals:   10/18/17 1146 10/18/17 1203  BP: (!) 95/55 115/63  Pulse: 68 87  Resp: 15 16  Temp:    SpO2: 100% 100%    Last Pain:  Vitals:   10/18/17 1203  TempSrc: Oral  PainSc:                  Nataliya Graig

## 2017-10-19 ENCOUNTER — Other Ambulatory Visit: Payer: Self-pay | Admitting: Psychiatry

## 2017-10-20 ENCOUNTER — Inpatient Hospital Stay: Admission: RE | Admit: 2017-10-20 | Payer: Self-pay | Source: Ambulatory Visit

## 2017-10-20 ENCOUNTER — Telehealth: Payer: Self-pay | Admitting: *Deleted

## 2017-10-20 MED ORDER — MIDAZOLAM HCL 2 MG/2ML IJ SOLN
INTRAMUSCULAR | Status: AC
  Filled 2017-10-20: qty 2

## 2017-10-20 MED ORDER — SUCCINYLCHOLINE CHLORIDE 20 MG/ML IJ SOLN
INTRAMUSCULAR | Status: AC
  Filled 2017-10-20: qty 1

## 2017-10-22 ENCOUNTER — Other Ambulatory Visit: Payer: Self-pay | Admitting: Psychiatry

## 2017-10-23 ENCOUNTER — Encounter: Payer: Self-pay | Admitting: Certified Registered Nurse Anesthetist

## 2017-10-23 ENCOUNTER — Inpatient Hospital Stay: Admission: RE | Admit: 2017-10-23 | Payer: Self-pay | Source: Ambulatory Visit

## 2017-10-23 ENCOUNTER — Telehealth: Payer: Self-pay | Admitting: *Deleted

## 2017-10-23 DIAGNOSIS — F329 Major depressive disorder, single episode, unspecified: Secondary | ICD-10-CM | POA: Diagnosis not present

## 2017-10-27 ENCOUNTER — Ambulatory Visit
Admission: RE | Admit: 2017-10-27 | Discharge: 2017-10-27 | Disposition: A | Discharge status: Home / Self Care | Payer: 59 | Source: Ambulatory Visit | Attending: Family | Admitting: Family

## 2017-10-27 ENCOUNTER — Ambulatory Visit: Payer: Self-pay | Admitting: *Deleted

## 2017-10-27 ENCOUNTER — Other Ambulatory Visit: Payer: Self-pay | Admitting: Family

## 2017-10-27 ENCOUNTER — Encounter: Payer: Self-pay | Admitting: Family

## 2017-10-27 ENCOUNTER — Other Ambulatory Visit: Payer: Self-pay | Admitting: Internal Medicine

## 2017-10-27 ENCOUNTER — Ambulatory Visit: Payer: 59 | Admitting: Family

## 2017-10-27 VITALS — BP 108/72 | HR 84 | Temp 98.9°F | Ht 70.0 in | Wt 124.1 lb

## 2017-10-27 DIAGNOSIS — N5089 Other specified disorders of the male genital organs: Secondary | ICD-10-CM

## 2017-10-27 MED ORDER — TRAMADOL HCL 50 MG PO TABS: 50.0000 mg | ORAL_TABLET | Freq: Three times a day (TID) | ORAL | 0 refills | Status: DC | PRN

## 2017-10-27 NOTE — Patient Instructions (Signed)
Please go directly to [facility]; will be in touch once results are back.

## 2017-10-27 NOTE — Telephone Encounter (Signed)
Mother is calling to report that her sone told her he was having pain and swelling in his scrotum. She answered the triage questions from the information that she had given him. Call to office to verify acute appointment could be given for former patient of [facility]- without NP appointment scheduled- OK by office given.  Reason for Disposition . Patient sounds very sick or weak to the triager  Answer Assessment - Initial Assessment Questions 1. SCROTAL SWELLING: "What does the scrotum look like?" "How swollen is it?" (mild, moderate severe; compare to other side)     Patient told mother last night- has been going on for a week- swollen and painful and red- worse when laying down at night 2. LOCATION: "Where is the swelling located?"     Left testicle 3. ONSET: "When did the swelling start?"     1 week 4. PATTERN: "Does it come and go, or has it been constant since it started?"     Comes and goes- worse at night 5. SCROTAL PAIN: "Is there any pain?" If so, ask: "How bad is it?"  (Scale 1-10; or mild, moderate, severe)     moderate 6. HERNIA: "Has a doctor ever told you that you have a hernia?"     5 years ago- enlargement noticed 7. OTHER SYMPTOMS: "Do you have any other symptoms?" (e.g., fever, abdominal pain, vomiting, difficulty passing urine)     Sensitivity and larger on that side- hydrocele Some complaints of abdominal pain  Protocols used: SCROTUM [facility]

## 2017-10-27 NOTE — Progress Notes (Signed)
I spoke personally and face to face with patient and his mother regarding these ultrasound results; discussed concern for underlying malignancy; will refer to urology for STAT referral; questions were answered and they agree and understand need for urology follow-up.

## 2017-10-27 NOTE — Progress Notes (Signed)
  Ricky Williams is a 22 y.o. male with the following history as recorded in EpicCare:  Patient Active Problem List   Diagnosis Date Noted  . Well adolescent visit 03/27/2017  . Ingrown toenail 04/02/2016  . Periumbilical abdominal pain 03/10/2014  . Hydrocele 04/02/2013    Current Outpatient Medications  Medication Sig Dispense Refill  . buPROPion (WELLBUTRIN SR) 100 MG 12 hr tablet Take 100 mg by mouth 2 (two) times daily.  2  . Melatonin 5 MG TABS Take 5 mg by mouth.    . Multiple Vitamins-Minerals (MULTIVITAMIN WITH MINERALS) tablet Take 1 tablet by mouth daily.    Marland Kitchen VIIBRYD 20 MG TABS TAKE 1 TABLET DAILY WITH EVENING MEAL  0  . Magnesium Oxide 250 MG TABS Take 250 mg by mouth.     No current facility-administered medications for this visit.     Allergies: Patient has no known allergies.  Past Medical History:  Diagnosis Date  . Allergy   . Anxiety   . Depression     No past surgical history on file.  Family History  Problem Relation Age of Onset  . Breast cancer Mother   . Prostate cancer Father   . Kidney Stones Father   . Arthritis Father        Psoriatic   . Multiple myeloma Maternal Grandmother   . Melanoma Maternal Grandfather   . Breast cancer Paternal Grandmother   . Healthy Paternal Grandfather     Social History   Tobacco Use  . Smoking status: Never Smoker  . Smokeless tobacco: Never Used  Substance Use Topics  . Alcohol use: No    Subjective:  Left sided scrotal pain/ left testicular swelling x 1 week; no fever; not sexually active; notes that area is tender to touch; no discomfort with urination; notes that pain is more problematic at night; does feel in general, his left testicle has been "more sensitive" recently; patient is obviously embarrassed in office and makes limited eye contact; mother accompanies patient today and offers history;     Objective:  Vitals:   10/27/17 1138  BP: 108/72  Pulse: 84  Temp: 98.9 F (37.2 C)  TempSrc: Oral   SpO2: 98%  Weight: 124 lb 1.3 oz (56.3 kg)  Height: '5\' 10"'$  (1.778 m)    General: Well developed, well nourished, in no acute distress  Skin : Warm and dry.  Head: Normocephalic and atraumatic  Lungs: Respirations unlabored; clear to auscultation bilaterally without wheeze, rales, rhonchi  Neurologic: Alert and oriented; speech intact; face symmetrical; moves all extremities well; CNII-XII intact without focal deficit  Left testicle erythematous/ swollen/ tender to touch  Assessment:  1. Testicular swelling, left     Plan:  STAT ultrasound today; will most likely need to start antibiotic and may need surgical consult; follow-up to be determined.   No Follow-up on file.  Orders Placed This Encounter  Procedures  . US Scrotum    Wt 124/ no needs/ ins [facility] Epic order    Standing Status:   Future    Standing Expiration Date:   12/26/2018    Order Specific Question:   Reason for Exam (SYMPTOM  OR DIAGNOSIS REQUIRED)    Answer:   left testicle pain/ swelling    Order Specific Question:   Preferred imaging location?    Answer:   [facility]    Requested Prescriptions    No prescriptions requested or ordered in this encounter

## 2017-10-30 ENCOUNTER — Other Ambulatory Visit: Payer: Self-pay

## 2017-10-30 ENCOUNTER — Other Ambulatory Visit: Payer: Self-pay | Admitting: Urology

## 2017-10-30 ENCOUNTER — Encounter (HOSPITAL_BASED_OUTPATIENT_CLINIC_OR_DEPARTMENT_OTHER): Payer: Self-pay | Admitting: *Deleted

## 2017-10-30 NOTE — Progress Notes (Signed)
SPOKE W/ PT MOTHER VIA PHONE FOR PRE-OP INTERVIEW.  NPO AFTER MN.  ARRIVE AT 0530.  NEEDS HG.

## 2017-11-01 ENCOUNTER — Other Ambulatory Visit: Payer: Self-pay

## 2017-11-01 ENCOUNTER — Encounter (HOSPITAL_BASED_OUTPATIENT_CLINIC_OR_DEPARTMENT_OTHER)
Admission: RE | Disposition: A | Discharge status: Home / Self Care | Payer: Self-pay | Source: Ambulatory Visit | Attending: Urology

## 2017-11-01 ENCOUNTER — Ambulatory Visit (HOSPITAL_BASED_OUTPATIENT_CLINIC_OR_DEPARTMENT_OTHER): Payer: 59 | Admitting: Anesthesiology

## 2017-11-01 ENCOUNTER — Encounter (HOSPITAL_BASED_OUTPATIENT_CLINIC_OR_DEPARTMENT_OTHER): Payer: Self-pay | Admitting: *Deleted

## 2017-11-01 ENCOUNTER — Ambulatory Visit: Payer: 59 | Admitting: Psychology

## 2017-11-01 ENCOUNTER — Ambulatory Visit (HOSPITAL_BASED_OUTPATIENT_CLINIC_OR_DEPARTMENT_OTHER)
Admission: RE | Admit: 2017-11-01 | Discharge: 2017-11-01 | Disposition: A | Discharge status: Home / Self Care | Payer: 59 | Source: Ambulatory Visit | Attending: Urology | Admitting: Urology

## 2017-11-01 DIAGNOSIS — C6292 Malignant neoplasm of left testis, unspecified whether descended or undescended: Secondary | ICD-10-CM | POA: Diagnosis not present

## 2017-11-01 DIAGNOSIS — F329 Major depressive disorder, single episode, unspecified: Secondary | ICD-10-CM | POA: Diagnosis not present

## 2017-11-01 DIAGNOSIS — F411 Generalized anxiety disorder: Secondary | ICD-10-CM | POA: Insufficient documentation

## 2017-11-01 DIAGNOSIS — Z888 Allergy status to other drugs, medicaments and biological substances status: Secondary | ICD-10-CM | POA: Diagnosis not present

## 2017-11-01 DIAGNOSIS — N509 Disorder of male genital organs, unspecified: Secondary | ICD-10-CM | POA: Diagnosis present

## 2017-11-01 HISTORY — DX: Other specified postprocedural states: Z98.890

## 2017-11-01 HISTORY — DX: Dermatitis, unspecified: L30.9

## 2017-11-01 HISTORY — PX: ORCHIECTOMY: SHX2116

## 2017-11-01 HISTORY — DX: Presence of spectacles and contact lenses: Z97.3

## 2017-11-01 HISTORY — DX: Other specified disorders of the male genital organs: N50.89

## 2017-11-01 HISTORY — DX: Generalized anxiety disorder: F41.1

## 2017-11-01 HISTORY — DX: Major depressive disorder, single episode, unspecified: F32.9

## 2017-11-01 SURGERY — ORCHIECTOMY
Anesthesia: General | Laterality: Left

## 2017-11-01 MED ORDER — LIDOCAINE 2% (20 MG/ML) 5 ML SYRINGE
INTRAMUSCULAR | Status: DC | PRN
  Administered 2017-11-01: 100 mg via INTRAVENOUS

## 2017-11-01 MED ORDER — FENTANYL CITRATE (PF) 100 MCG/2ML IJ SOLN
INTRAMUSCULAR | Status: DC | PRN
  Administered 2017-11-01: 50 ug via INTRAVENOUS

## 2017-11-01 MED ORDER — PROMETHAZINE HCL 25 MG/ML IJ SOLN
6.2500 mg | INTRAMUSCULAR | Status: DC | PRN
  Filled 2017-11-01: qty 1

## 2017-11-01 MED ORDER — DEXAMETHASONE SODIUM PHOSPHATE 10 MG/ML IJ SOLN
INTRAMUSCULAR | Status: AC
Start: 2017-11-01 — End: ?
  Filled 2017-11-01: qty 1

## 2017-11-01 MED ORDER — DEXAMETHASONE SODIUM PHOSPHATE 10 MG/ML IJ SOLN
INTRAMUSCULAR | Status: DC | PRN
  Administered 2017-11-01: 10 mg via INTRAVENOUS

## 2017-11-01 MED ORDER — CEFAZOLIN SODIUM-DEXTROSE 2-4 GM/100ML-% IV SOLN
INTRAVENOUS | Status: AC
Start: 2017-11-01 — End: ?
  Filled 2017-11-01: qty 100

## 2017-11-01 MED ORDER — ONDANSETRON HCL 4 MG/2ML IJ SOLN
INTRAMUSCULAR | Status: AC
Start: 2017-11-01 — End: ?
  Filled 2017-11-01: qty 2

## 2017-11-01 MED ORDER — CEFAZOLIN SODIUM-DEXTROSE 2-4 GM/100ML-% IV SOLN
2.0000 g | Freq: Once | INTRAVENOUS | Status: AC
  Administered 2017-11-01: 2 g via INTRAVENOUS
  Filled 2017-11-01: qty 100

## 2017-11-01 MED ORDER — PROPOFOL 10 MG/ML IV BOLUS
INTRAVENOUS | Status: DC | PRN
  Administered 2017-11-01: 150 mg via INTRAVENOUS
  Administered 2017-11-01 (×2): 50 mg via INTRAVENOUS

## 2017-11-01 MED ORDER — ONDANSETRON HCL 4 MG PO TABS: 4.0000 mg | ORAL_TABLET | Freq: Every day | ORAL | 1 refills | Status: DC | PRN

## 2017-11-01 MED ORDER — ACETAMINOPHEN 500 MG PO TABS
1000.0000 mg | ORAL_TABLET | Freq: Once | ORAL | Status: AC
  Administered 2017-11-01: 1000 mg via ORAL
  Filled 2017-11-01: qty 2

## 2017-11-01 MED ORDER — CEPHALEXIN 500 MG PO CAPS: 500.0000 mg | ORAL_CAPSULE | Freq: Three times a day (TID) | ORAL | 0 refills | Status: AC

## 2017-11-01 MED ORDER — FENTANYL CITRATE (PF) 100 MCG/2ML IJ SOLN
INTRAMUSCULAR | Status: AC
  Filled 2017-11-01: qty 2

## 2017-11-01 MED ORDER — FENTANYL CITRATE (PF) 100 MCG/2ML IJ SOLN
25.0000 ug | INTRAMUSCULAR | Status: DC | PRN
  Administered 2017-11-01 (×2): 25 ug via INTRAVENOUS
  Filled 2017-11-01: qty 1

## 2017-11-01 MED ORDER — EPHEDRINE 5 MG/ML INJ
INTRAVENOUS | Status: AC
  Filled 2017-11-01: qty 10

## 2017-11-01 MED ORDER — ONDANSETRON HCL 4 MG/2ML IJ SOLN
INTRAMUSCULAR | Status: DC | PRN
  Administered 2017-11-01: 4 mg via INTRAVENOUS

## 2017-11-01 MED ORDER — BUPIVACAINE HCL (PF) 0.25 % IJ SOLN
INTRAMUSCULAR | Status: DC | PRN
  Administered 2017-11-01: 10 mL

## 2017-11-01 MED ORDER — LIDOCAINE 2% (20 MG/ML) 5 ML SYRINGE
INTRAMUSCULAR | Status: AC
  Filled 2017-11-01: qty 5

## 2017-11-01 MED ORDER — PROPOFOL 10 MG/ML IV BOLUS
INTRAVENOUS | Status: AC
Start: 2017-11-01 — End: ?
  Filled 2017-11-01: qty 40

## 2017-11-01 MED ORDER — HYDROCODONE-ACETAMINOPHEN 5-325 MG PO TABS
ORAL_TABLET | ORAL | Status: AC
  Filled 2017-11-01: qty 1

## 2017-11-01 MED ORDER — MIDAZOLAM HCL 2 MG/2ML IJ SOLN
INTRAMUSCULAR | Status: AC
  Filled 2017-11-01: qty 2

## 2017-11-01 MED ORDER — WHITE PETROLATUM EX OINT
TOPICAL_OINTMENT | CUTANEOUS | Status: AC
  Filled 2017-11-01: qty 5

## 2017-11-01 MED ORDER — EPHEDRINE SULFATE 50 MG/ML IJ SOLN
INTRAMUSCULAR | Status: DC | PRN
  Administered 2017-11-01: 10 mg via INTRAVENOUS

## 2017-11-01 MED ORDER — DEXAMETHASONE SODIUM PHOSPHATE 10 MG/ML IJ SOLN
INTRAMUSCULAR | Status: AC
  Filled 2017-11-01: qty 1

## 2017-11-01 MED ORDER — MIDAZOLAM HCL 2 MG/2ML IJ SOLN
INTRAMUSCULAR | Status: DC | PRN
  Administered 2017-11-01: 1 mg via INTRAVENOUS

## 2017-11-01 MED ORDER — LACTATED RINGERS IV SOLN
INTRAVENOUS | Status: DC
  Administered 2017-11-01 (×2): via INTRAVENOUS
  Filled 2017-11-01: qty 1000

## 2017-11-01 MED ORDER — ACETAMINOPHEN 500 MG PO TABS
ORAL_TABLET | ORAL | Status: AC
  Filled 2017-11-01: qty 2

## 2017-11-01 MED ORDER — HYDROCODONE-ACETAMINOPHEN 5-325 MG PO TABS
1.0000 | ORAL_TABLET | Freq: Four times a day (QID) | ORAL | Status: DC | PRN
  Administered 2017-11-01: 1 via ORAL
  Filled 2017-11-01: qty 1

## 2017-11-01 SURGICAL SUPPLY — 37 items
BLADE CLIPPER SENSICLIP SURGIC (BLADE) ×3 IMPLANT
BLADE HEX COATED 2.75 (ELECTRODE) ×3 IMPLANT
BLADE SURG 15 STRL LF DISP TIS (BLADE) ×1 IMPLANT
BLADE SURG 15 STRL SS (BLADE) ×2
BNDG GAUZE ELAST 4 BULKY (GAUZE/BANDAGES/DRESSINGS) ×3 IMPLANT
COVER BACK TABLE 60X90IN (DRAPES) ×3 IMPLANT
COVER MAYO STAND STRL (DRAPES) ×3 IMPLANT
DERMABOND ADVANCED (GAUZE/BANDAGES/DRESSINGS) ×2
DERMABOND ADVANCED .7 DNX12 (GAUZE/BANDAGES/DRESSINGS) ×1 IMPLANT
DISSECTOR ROUND CHERRY 3/8 STR (MISCELLANEOUS) ×3 IMPLANT
DRAIN PENROSE 18X1/2 LTX STRL (DRAIN) ×3 IMPLANT
DRAPE LAPAROTOMY 100X72 PEDS (DRAPES) ×3 IMPLANT
ELECT REM PT RETURN 9FT ADLT (ELECTROSURGICAL) ×3
ELECTRODE REM PT RTRN 9FT ADLT (ELECTROSURGICAL) ×1 IMPLANT
GLOVE BIOGEL M STRL SZ7.5 (GLOVE) ×3 IMPLANT
GLOVE BIOGEL PI IND STRL 7.5 (GLOVE) ×1 IMPLANT
GLOVE BIOGEL PI INDICATOR 7.5 (GLOVE) ×2
GOWN STRL REUS W/TWL XL LVL3 (GOWN DISPOSABLE) ×3 IMPLANT
NEEDLE HYPO 22GX1.5 SAFETY (NEEDLE) ×3 IMPLANT
NS IRRIG 500ML POUR BTL (IV SOLUTION) ×3 IMPLANT
PACK BASIN DAY SURGERY FS (CUSTOM PROCEDURE TRAY) ×3 IMPLANT
PENCIL BUTTON HOLSTER BLD 10FT (ELECTRODE) ×3 IMPLANT
SUPPORT SCROTAL LG STRP (MISCELLANEOUS) ×2 IMPLANT
SUPPORTER ATHLETIC LG (MISCELLANEOUS) ×1
SUT CHROMIC 3 0 SH 27 (SUTURE) ×3 IMPLANT
SUT MNCRL AB 4-0 PS2 18 (SUTURE) ×3 IMPLANT
SUT SILK 2 0 SH (SUTURE) ×6 IMPLANT
SUT VIC AB 2-0 SH 27 (SUTURE) ×4
SUT VIC AB 2-0 SH 27XBRD (SUTURE) ×2 IMPLANT
SUT VIC AB 2-0 UR5 27 (SUTURE) IMPLANT
SUT VIC AB 4-0 PS2 18 (SUTURE) IMPLANT
SUT VICRYL 0 TIES 12 18 (SUTURE) IMPLANT
SYR CONTROL 10ML LL (SYRINGE) ×3 IMPLANT
TOWEL OR 17X24 6PK STRL BLUE (TOWEL DISPOSABLE) ×3 IMPLANT
TUBE CONNECTING 12'X1/4 (SUCTIONS) ×1
TUBE CONNECTING 12X1/4 (SUCTIONS) ×2 IMPLANT
YANKAUER SUCT BULB TIP NO VENT (SUCTIONS) ×3 IMPLANT

## 2017-11-01 NOTE — Discharge Instructions (Signed)

## 2017-11-01 NOTE — H&P (Signed)
Urology Preoperative H&P    Chief Complaint: Left testicular pain  History of Present Illness: Ricky Williams is a 22 y.o. male with one-week history of progressively worsening, constant, non-radiating left testicular pain and enlargement. He had a scrotal ultrasound on 10/27/1917 showed a testicular lesion concerning for malignancy (results listed below).   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  No personal/family history of testis cancer. No recent testicular trauma, infections or surgeries.   1. No testicular torsion.  2. 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 Urology consultation and correlation with serum tumor biomarkers advised.    Past Medical History:  Diagnosis Date  . Eczema   . GAD (generalized anxiety disorder)   . Major depression    total 5 electroculsive treatments last one 10-18-2017  . Mass of left testicle   . S/P ECT (electroconvulsive therapy) x5 treatments  last one 10-18-2017  . Wears contact lenses     Past Surgical History:  Procedure Laterality Date  . NO PAST SURGERIES      Allergies:  Allergies  Allergen Reactions  . Adhesive [Tape] Other (See Comments)    "SKIN GETS VERY IRRITATED"    Family History  Problem Relation Age of Onset  . Breast cancer Mother   . Prostate cancer Father   . Kidney Stones Father   . Arthritis Father        Psoriatic   . Multiple myeloma Maternal Grandmother   . Melanoma Maternal Grandfather   . Breast cancer Paternal Grandmother   . Healthy Paternal Grandfather     Social History:  reports that  has never smoked. he has never used smokeless tobacco. He reports that he does not drink alcohol or use drugs.  ROS: A complete review of systems was performed.  All systems are negative except for pertinent findings as noted.  Physical Exam:  Vital signs in last 24  hours: Temp:  [97.4 F (36.3 C)] 97.4 F (36.3 C) (02/27 0548) Pulse Rate:  [66] 66 (02/27 0548) Resp:  [16] 16 (02/27 0548) BP: (109)/(68) 109/68 (02/27 0548) SpO2:  [100 %] 100 % (02/27 0548) Weight:  [56.2 kg (123 lb 12.8 oz)] 56.2 kg (123 lb 12.8 oz) (02/27 0548) Constitutional:  Alert and oriented, No acute distress Cardiovascular: Regular rate and rhythm, No JVD Respiratory: Normal respiratory effort, Lungs clear bilaterally GI: Abdomen is soft, nontender, nondistended, no abdominal masses GU: No CVA tenderness, firm testicular mass involving the left testicle  Lymphatic: No lymphadenopathy Neurologic: Grossly intact, no focal deficits Psychiatric: Normal mood and affect  Laboratory Data:  No results for input(s): WBC, HGB, HCT, PLT in the last 72 hours.  No results for input(s): NA, K, CL, GLUCOSE, BUN, CALCIUM, CREATININE in the last 72 hours.  Invalid input(s): CO3   No results found for this or any previous visit (from the past 24 hour(s)). No results found for this or any previous visit (from the past 240 hour(s)).  Renal Function: No results for input(s): CREATININE in the last 168 hours. CrCl cannot be calculated (Patient's most recent lab result is older than the maximum 21 days allowed.).  Radiologic Imaging: No results found.  I independently reviewed the above imaging studies.  Assessment and Plan Ricky Williams is a 22 y.o. male with a left testicular mass concerning for malignancy  -The risks, benefits and  alternatives of LEFT inguinal orchiectomy was discussed with the patient and his parents. Risks include, but are not limited to bleeding, wound infection, positive surgical margin and the inherent risk of general anesthesia. They voice understanding and wished to proceed   Ellison Hughs, MD 11/01/2017, 7:26 AM  [facility] Pager: [phone]

## 2017-11-01 NOTE — Anesthesia Postprocedure Evaluation (Signed)
Anesthesia Post Note  Patient: Ricky Williams  Procedure(s) Performed: LEFT ORCHIECTOMY (Left )     Patient location during evaluation: PACU Anesthesia Type: General Level of consciousness: awake and alert, oriented, awake and patient cooperative Pain management: pain level controlled Vital Signs Assessment: post-procedure vital signs reviewed and stable Respiratory status: spontaneous breathing, nonlabored ventilation and respiratory function stable Cardiovascular status: blood pressure returned to baseline and stable Postop Assessment: no apparent nausea or vomiting Anesthetic complications: no    Last Vitals:  Vitals:   11/01/17 0930 11/01/17 0945  BP: 106/66 117/70  Pulse: 66 81  Resp: 19 13  Temp:    SpO2: 100% 100%    Last Pain:  Vitals:   11/01/17 1000  TempSrc:   PainSc: 7                  Catalina Gravel

## 2017-11-01 NOTE — Transfer of Care (Signed)
Immediate Anesthesia Transfer of Care Note  Patient: Ricky Williams  Procedure(s) Performed: LEFT ORCHIECTOMY (Left )  Patient Location: PACU  Anesthesia Type:General  Level of Consciousness: drowsy and patient cooperative  Airway & Oxygen Therapy: Patient Spontanous Breathing and Patient connected to nasal cannula oxygen  Post-op Assessment: Report given to RN and Post -op Vital signs reviewed and stable  Post vital signs: Reviewed and stable  Last Vitals:  Vitals:   11/01/17 0548  BP: 109/68  Pulse: 66  Resp: 16  Temp: (!) 36.3 C  SpO2: 100%    Last Pain:  Vitals:   11/01/17 0548  TempSrc: Oral      Patients Stated Pain Goal: 4 (06/21/50 0258)  Complications: No apparent anesthesia complications

## 2017-11-01 NOTE — Interval H&P Note (Signed)
History and Physical Interval Note:  11/01/2017 7:30 AM  Ricky Williams  has presented today for surgery, with the diagnosis of LEFT TESTICULAR MASS  The various methods of treatment have been discussed with the patient and family. After consideration of risks, benefits and other options for treatment, the patient has consented to  Procedure(s): LEFT ORCHIECTOMY (Left) as a surgical intervention .  The patient's history has been reviewed, patient examined, no change in status, stable for surgery.  I have reviewed the patient's chart and labs.  Questions were answered to the patient's satisfaction.     Conception Oms Sindee Stucker

## 2017-11-01 NOTE — Anesthesia Procedure Notes (Signed)
Procedure Name: LMA Insertion Date/Time: 11/01/2017 8:18 AM Performed by: Wanita Chamberlain, CRNA Pre-anesthesia Checklist: Patient identified, Timeout performed, Emergency Drugs available, Suction available and Patient being monitored Patient Re-evaluated:Patient Re-evaluated prior to induction Oxygen Delivery Method: Circle system utilized Preoxygenation: Pre-oxygenation with 100% oxygen Induction Type: IV induction Ventilation: Mask ventilation without difficulty LMA: LMA inserted LMA Size: 4.0 Number of attempts: 2 Airway Equipment and Method: Bite block (gauze) Placement Confirmation: positive ETCO2,  CO2 detector and breath sounds checked- equal and bilateral Tube secured with: Tape Dental Injury: Teeth and Oropharynx as per pre-operative assessment

## 2017-11-01 NOTE — Op Note (Signed)
Operative Note  Preoperative diagnosis:  1.  Left testicular mass  Postoperative diagnosis: 1.  Left testicular mass  Procedure(s): 1.  Left inguinal orchiectomy  Surgeon: Ellison Hughs, MD  Assistants: None  Anesthesia: General LMA  Complications: None  EBL: 5 mL  Specimens: 1.  Left testicle and spermatic cord  Drains/Catheters: 1.  None  Intraoperative findings: Palpable left testicular mass  Indication:  Ricky Williams is a 22 y.o. male with a one-week history of progressively worsening, constant, non-radiating left testicular pain and enlargement. He had a scrotal ultrasound on 10/27/1917 showed a testicular lesion concerning for malignancy.   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  No personal/family history of testis cancer. No recent testicular trauma, infections or surgeries.   Description of procedure: After informed consent was obtained, the patient was brought to the operating room and general LMA anesthesia was administered.  The patient was prepped and draped in the usual sterile fashion.  A timeout was then performed.  A 5 cm left inguinal incision was made in the overlying subcutaneous tissue was incised with electrocautery until the external oblique fascia and the left spermatic cord was identified.  The spermatic cord was then swept away from the posterior side of the external oblique aponeurosis.  The external oblique fascia was then incised sharply using Metzenbaum scissors.  The ilioinguinal nerve was identified and appeared to be intertwined within the proximal aspect of the spermatic cord and cannot be spared.  The left spermatic cord was then isolated and freely dissected as proximally as possible to the left internal inguinal ring.  The left testicle was then expressed through the inguinal incision and the gubernacular attachments were incised  using electrocautery.  There is no evidence of scrotal violation following incision of the gubernacular attachments.  The left spermatic cord was then bisected and clamped with 2 Kelly clamps.  The left testicle and spermatic cord were then excised and sent to pathology for permanent specimen.  The spermatic cord stumps were then suture ligated with 2-0 silk sutures, leaving a tail on each end.  There was excellent hemostasis following suture ligation.  The external oblique aponeurosis was then reapproximated using a 2-0 Vicryl suture.  The skin was then closed using a running 4-0 Monocryl and dressed with Dermabond.  The patient tolerated the procedure well and was transferred to the postanesthesia unit in stable condition.  Plan: Follow-up in 1 week to discuss pathology results and serum tumor markers.

## 2017-11-01 NOTE — Anesthesia Preprocedure Evaluation (Addendum)
Anesthesia Evaluation  Patient identified by MRN, date of birth, ID band Patient awake    Reviewed: Allergy & Precautions, NPO status , Patient's Chart, lab work & pertinent test results  Airway Mallampati: II  TM Distance: >3 FB Neck ROM: Full    Dental  (+) Teeth Intact, Dental Advisory Given   Pulmonary neg pulmonary ROS,    Pulmonary exam normal breath sounds clear to auscultation       Cardiovascular Exercise Tolerance: Good negative cardio ROS Normal cardiovascular exam Rhythm:Regular Rate:Normal     Neuro/Psych PSYCHIATRIC DISORDERS (ECT treatments for depression) Anxiety Depression negative neurological ROS     GI/Hepatic negative GI ROS, Neg liver ROS,   Endo/Other  negative endocrine ROS  Renal/GU negative Renal ROS   Left testicular mass    Musculoskeletal negative musculoskeletal ROS (+)   Abdominal   Peds  Hematology negative hematology ROS (+)   Anesthesia Other Findings Day of surgery medications reviewed with the patient.  Reproductive/Obstetrics                             Anesthesia Physical Anesthesia Plan  ASA: II  Anesthesia Plan: General   Post-op Pain Management:    Induction: Intravenous  PONV Risk Score and Plan: 3 and Midazolam, Dexamethasone and Ondansetron  Airway Management Planned: LMA  Additional Equipment:   Intra-op Plan:   Post-operative Plan: Extubation in OR  Informed Consent: I have reviewed the patients History and Physical, chart, labs and discussed the procedure including the risks, benefits and alternatives for the proposed anesthesia with the patient or authorized representative who has indicated his/her understanding and acceptance.   Dental advisory given  Plan Discussed with: CRNA  Anesthesia Plan Comments:         Anesthesia Quick Evaluation

## 2017-11-02 ENCOUNTER — Encounter (HOSPITAL_BASED_OUTPATIENT_CLINIC_OR_DEPARTMENT_OTHER): Payer: Self-pay | Admitting: Urology

## 2017-11-02 LAB — POCT HEMOGLOBIN-HEMACUE: HEMOGLOBIN: 14.3 g/dL (ref 13.0–17.0)

## 2017-11-07 ENCOUNTER — Telehealth: Payer: Self-pay | Admitting: Family

## 2017-11-07 NOTE — Telephone Encounter (Unsigned)
Copied from [place]. Topic: Appointment Scheduling - Scheduling Inquiry for Clinic >> Nov 07, 2017  4:33 PM Percell Belt A wrote: Reason for CRM: pt mother called in and would like to know if one of male DR would be will to take him on as a new pt.  He was seen [employer] number [phone]

## 2017-11-15 ENCOUNTER — Ambulatory Visit: Payer: 59 | Admitting: Psychology

## 2017-11-29 ENCOUNTER — Ambulatory Visit: Payer: 59 | Admitting: Psychology

## 2017-11-29 DIAGNOSIS — F331 Major depressive disorder, recurrent, moderate: Secondary | ICD-10-CM | POA: Diagnosis not present

## 2017-12-13 ENCOUNTER — Ambulatory Visit: Payer: 59 | Admitting: Psychology

## 2017-12-13 DIAGNOSIS — F331 Major depressive disorder, recurrent, moderate: Secondary | ICD-10-CM

## 2017-12-26 ENCOUNTER — Encounter: Payer: Self-pay | Admitting: Oncology

## 2017-12-26 ENCOUNTER — Telehealth: Payer: Self-pay | Admitting: Oncology

## 2017-12-26 NOTE — Telephone Encounter (Signed)
An appointment has been scheduled for the pt to see Dr. Alen Blew on 5/3 at 2pm. Gave the appt date and time to the pt's mother. Letter mailed to the pt and faxed to the referring.

## 2017-12-27 ENCOUNTER — Ambulatory Visit: Payer: 59 | Admitting: Psychology

## 2017-12-27 DIAGNOSIS — F331 Major depressive disorder, recurrent, moderate: Secondary | ICD-10-CM | POA: Diagnosis not present

## 2018-01-02 ENCOUNTER — Encounter: Payer: Self-pay | Admitting: Internal Medicine

## 2018-01-02 ENCOUNTER — Ambulatory Visit: Payer: 59 | Admitting: Internal Medicine

## 2018-01-02 VITALS — BP 114/76 | HR 63 | Temp 98.6°F | Ht 69.0 in | Wt 124.0 lb

## 2018-01-02 DIAGNOSIS — N492 Inflammatory disorders of scrotum: Secondary | ICD-10-CM | POA: Insufficient documentation

## 2018-01-02 MED ORDER — DOXYCYCLINE HYCLATE 100 MG PO TABS: 100.0000 mg | ORAL_TABLET | Freq: Two times a day (BID) | ORAL | 0 refills | Status: DC

## 2018-01-02 NOTE — Patient Instructions (Addendum)
Please take all new medication as prescribed - the antibiotic  Please continue all other medications as before, and refills have been done if requested.  Please have the pharmacy call with any other refills you may need.  Please keep your appointments with your specialists as you may have planned   

## 2018-01-02 NOTE — Progress Notes (Signed)
Subjective:    Patient ID: Ricky Williams, male    DOB: July 28, 1996, 22 y.o.   MRN: [id number]  HPI  Here with 3-4 days onset red, tender painful area to left proximal scrotum assoc with some oozing material today, without fever and Denies urinary symptoms such as dysuria, frequency, urgency, flank pain, hematuria or n/v, fever, chills.  He is incidentally s/p left pelvis undescended testicle removal about 1 mo ago, and did well on post op f/u with urology after that.  No complaints of incisions site.  No hx of known MRSA though has had significant recent medical site exposure. No prior hx of this,  Pt denies fever, wt loss, night sweats, loss of appetite, or other constitutional symptoms Past Medical History:  Diagnosis Date  . Eczema   . GAD (generalized anxiety disorder)   . Major depression    total 5 electroculsive treatments last one 10-18-2017  . Mass of left testicle   . S/P ECT (electroconvulsive therapy) x5 treatments  last one 10-18-2017  . Wears contact lenses    Past Surgical History:  Procedure Laterality Date  . NO PAST SURGERIES    . ORCHIECTOMY Left 11/01/2017   Procedure: LEFT ORCHIECTOMY;  Surgeon: Ceasar Mons, MD;  Location: [facility];  Service: Urology;  Laterality: Left;    reports that he has never smoked. He has never used smokeless tobacco. He reports that he does not drink alcohol or use drugs. family history includes Arthritis in his father; Breast cancer in his mother and paternal grandmother; Healthy in his paternal grandfather; Kidney Stones in his father; Melanoma in his maternal grandfather; Multiple myeloma in his maternal grandmother; Prostate cancer in his father. Allergies  Allergen Reactions  . Adhesive [Tape] Other (See Comments)    "SKIN GETS VERY IRRITATED"   Current Outpatient Medications on File Prior to Visit  Medication Sig Dispense Refill  . acetaminophen (TYLENOL) 325 MG tablet Take 650 mg by mouth every 6  (six) hours as needed.    Marland Kitchen buPROPion (WELLBUTRIN SR) 100 MG 12 hr tablet Take 100 mg by mouth 2 (two) times daily.   2  . diphenhydrAMINE (BENADRYL) 25 MG tablet Take 25 mg by mouth every 6 (six) hours as needed.    Marland Kitchen HYDROcodone-acetaminophen (NORCO/VICODIN) 5-325 MG tablet Take 1 tablet by mouth every 6 (six) hours as needed for moderate pain.    Marland Kitchen loratadine (CLARITIN) 10 MG tablet Take 10 mg by mouth daily as needed for allergies.    Marland Kitchen lurasidone (LATUDA) 20 MG TABS tablet Take by mouth.    . Magnesium Oxide 250 MG TABS Take 250 mg by mouth daily.     . Melatonin 5 MG TABS Take 5 mg by mouth at bedtime.     . mirtazapine (REMERON) 7.5 MG tablet Take 7.5 mg by mouth at bedtime.    . Multiple Vitamins-Minerals (MULTIVITAMIN WITH MINERALS) tablet Take 1 tablet by mouth daily.    . ondansetron (ZOFRAN) 4 MG tablet Take 1 tablet (4 mg total) by mouth daily as needed for nausea or vomiting. 30 tablet 1  . Polyethylene Glycol 3350 (MIRALAX PO) Take by mouth every other day.    . traMADol (ULTRAM) 50 MG tablet Take 1 tablet (50 mg total) by mouth every 8 (eight) hours as needed. 30 tablet 0  . Vilazodone HCl (VIIBRYD) 10 MG TABS Take 10 mg by mouth at bedtime.     No current facility-administered medications on file prior to  visit.    Review of Systems  All other system neg per pt    Objective:   Physical Exam BP 114/76   Pulse 63   Temp 98.6 F (37 C) (Oral)   Ht 5' 9"  (1.753 m)   Wt 124 lb (56.2 kg)   SpO2 99%   BMI 18.31 kg/m  VS noted,  Constitutional: Pt appears in NAD HENT: Head: NCAT.  Right Ear: External ear normal.  Left Ear: External ear normal.  Eyes: . Pupils are equal, round, and reactive to light. Conjunctivae and EOM are normal Nose: without d/c or deformity Neck: Neck supple. Gross normal ROM Cardiovascular: Normal rate and regular rhythm.   Pulmonary/Chest: Effort normal and breath sounds without rales or wheezing.  Abd:  Soft, NT, ND, + BS, no organomegaly,  left low abd incision site intact without redness/tender or swelling GU: no left testicle noted (never present before); left scrotum with 2 cm area red/tender swelling ? Abscess vs cyst Neurological: Pt is alert. At baseline orientation, motor grossly intact Skin: Skin is warm. No rashes, other new lesions, no LE edema Psychiatric: Pt behavior is normal without agitation  No other exam findings    Assessment & Plan:

## 2018-01-02 NOTE — Assessment & Plan Note (Signed)
Mild to mod, for antibx course,  to f/u any worsening symptoms or concerns 

## 2018-01-05 ENCOUNTER — Inpatient Hospital Stay: Payer: 59 | Attending: Oncology | Admitting: Oncology

## 2018-01-05 VITALS — BP 107/68 | HR 85 | Temp 98.2°F | Resp 1 | Ht 69.0 in | Wt 124.2 lb

## 2018-01-05 DIAGNOSIS — F419 Anxiety disorder, unspecified: Secondary | ICD-10-CM

## 2018-01-05 DIAGNOSIS — Z5111 Encounter for antineoplastic chemotherapy: Secondary | ICD-10-CM | POA: Insufficient documentation

## 2018-01-05 DIAGNOSIS — Z801 Family history of malignant neoplasm of trachea, bronchus and lung: Secondary | ICD-10-CM

## 2018-01-05 DIAGNOSIS — Z800 Family history of malignant neoplasm of digestive organs: Secondary | ICD-10-CM | POA: Insufficient documentation

## 2018-01-05 DIAGNOSIS — R110 Nausea: Secondary | ICD-10-CM | POA: Insufficient documentation

## 2018-01-05 DIAGNOSIS — Z803 Family history of malignant neoplasm of breast: Secondary | ICD-10-CM | POA: Insufficient documentation

## 2018-01-05 DIAGNOSIS — F418 Other specified anxiety disorders: Secondary | ICD-10-CM | POA: Insufficient documentation

## 2018-01-05 DIAGNOSIS — Z8042 Family history of malignant neoplasm of prostate: Secondary | ICD-10-CM | POA: Insufficient documentation

## 2018-01-05 DIAGNOSIS — R451 Restlessness and agitation: Secondary | ICD-10-CM | POA: Insufficient documentation

## 2018-01-05 DIAGNOSIS — Z79899 Other long term (current) drug therapy: Secondary | ICD-10-CM | POA: Insufficient documentation

## 2018-01-05 DIAGNOSIS — C6292 Malignant neoplasm of left testis, unspecified whether descended or undescended: Secondary | ICD-10-CM | POA: Insufficient documentation

## 2018-01-05 DIAGNOSIS — T8090XA Unspecified complication following infusion and therapeutic injection, initial encounter: Secondary | ICD-10-CM | POA: Insufficient documentation

## 2018-01-05 DIAGNOSIS — Z7189 Other specified counseling: Secondary | ICD-10-CM

## 2018-01-05 DIAGNOSIS — Z806 Family history of leukemia: Secondary | ICD-10-CM | POA: Diagnosis not present

## 2018-01-05 NOTE — Addendum Note (Signed)
Addended by: Amelia Jo I on: 01/05/2018 02:56 PM   Modules accepted: Orders

## 2018-01-05 NOTE — Progress Notes (Signed)
Reason for Referral: Testis cancer.  HPI: 22 year old man with history of generalized anxiety disorder and hydrocele and a recent diagnosis of testis cancer.  He was in his usual state of health until he presented in February 2019 with increased left testicular pain and enlargement.  Ultrasound at that time showed a testicular mass concerning for malignancy.  He underwent evaluation by Dr. Lovena Neighbours and found to have a 4.7 x 3.4 x 4.8 cm left testicular mass that is concerning for malignancy.  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  His his tumor is not involved margins and lymphovascular invasion was detected.  Final pathological staging was T2NX.  CT scan obtained on November 09, 2017 of the chest abdomen and pelvis showed no evidence of adenopathy or metastatic disease.  His initial alpha-fetoprotein was 255.4, beta-hCG was 19 and LDH was 62.  Postoperatively on December 07, 2017 his alpha-fetoprotein was 5, beta-hCG was less than 1 and LDH was 122.  Clinically, he reports he has felt well since his operation and fully recovered.  He did have a cyst on his scrotum and currently taking antibiotics.  He denies any fevers or chills.  He reports his mood is stable at this time and currently stable on his antidepressant medication.    He  does not report any headaches, blurry vision, syncope or seizures. Does not report any fevers, chills or sweats.  Does not report any cough, wheezing or hemoptysis.  Does not report any chest pain, palpitation, orthopnea or leg edema.  Does not report any nausea, vomiting or abdominal pain.  Does not report any constipation or diarrhea.  Does not report any skeletal complaints.    Does not report frequency, urgency or hematuria.  Does not report any skin rashes or lesions. Does not report any heat or cold intolerance.  Does not report any lymphadenopathy or  petechiae.  Does not report any anxiety or depression.  Remaining review of systems is negative.    Past Medical History:  Diagnosis Date  . Eczema   . GAD (generalized anxiety disorder)   . Major depression    total 5 electroculsive treatments last one 10-18-2017  . Mass of left testicle   . S/P ECT (electroconvulsive therapy) x5 treatments  last one 10-18-2017  . Wears contact lenses   :  Past Surgical History:  Procedure Laterality Date  . NO PAST SURGERIES    . ORCHIECTOMY Left 11/01/2017   Procedure: LEFT ORCHIECTOMY;  Surgeon: Ceasar Mons, MD;  Location: [facility];  Service: Urology;  Laterality: Left;  :   Current Outpatient Medications:  .  acetaminophen (TYLENOL) 325 MG tablet, Take 650 mg by mouth every 6 (six) hours as needed., Disp: , Rfl:  .  buPROPion (WELLBUTRIN SR) 100 MG 12 hr tablet, Take 100 mg by mouth 2 (two) times daily. , Disp: , Rfl: 2 .  diphenhydrAMINE (BENADRYL) 25 MG tablet, Take 25 mg by mouth every 6 (six) hours as needed., Disp: , Rfl:  .  doxycycline (VIBRA-TABS) 100 MG tablet, Take 1 tablet (100 mg total) by mouth 2 (two) times daily., Disp: 20 tablet, Rfl: 0 .  HYDROcodone-acetaminophen (NORCO/VICODIN) 5-325 MG tablet, Take 1 tablet by mouth every 6 (six) hours as needed for moderate pain., Disp: , Rfl:  .  loratadine (CLARITIN) 10 MG tablet, Take 10 mg by mouth daily as  needed for allergies., Disp: , Rfl:  .  lurasidone (LATUDA) 20 MG TABS tablet, Take by mouth., Disp: , Rfl:  .  Magnesium Oxide 250 MG TABS, Take 250 mg by mouth daily. , Disp: , Rfl:  .  Melatonin 5 MG TABS, Take 5 mg by mouth at bedtime. , Disp: , Rfl:  .  mirtazapine (REMERON) 7.5 MG tablet, Take 7.5 mg by mouth at bedtime., Disp: , Rfl:  .  Multiple Vitamins-Minerals (MULTIVITAMIN WITH MINERALS) tablet, Take 1 tablet by mouth daily., Disp: , Rfl:  .  ondansetron (ZOFRAN) 4 MG tablet, Take 1 tablet (4 mg total) by mouth daily as needed for nausea or  vomiting., Disp: 30 tablet, Rfl: 1 .  Polyethylene Glycol 3350 (MIRALAX PO), Take by mouth every other day., Disp: , Rfl:  .  traMADol (ULTRAM) 50 MG tablet, Take 1 tablet (50 mg total) by mouth every 8 (eight) hours as needed., Disp: 30 tablet, Rfl: 0 .  Vilazodone HCl (VIIBRYD) 10 MG TABS, Take 10 mg by mouth at bedtime., Disp: , Rfl: :  Allergies  Allergen Reactions  . Adhesive [Tape] Other (See Comments)    "SKIN GETS VERY IRRITATED"  :  Family History  Problem Relation Age of Onset  . Breast cancer Mother   . Prostate cancer Father   . Kidney Stones Father   . Arthritis Father        Psoriatic   . Multiple myeloma Maternal Grandmother   . Melanoma Maternal Grandfather   . Breast cancer Paternal Grandmother   . Healthy Paternal Grandfather   :  Social History   Socioeconomic History  . Marital status: Single    Spouse name: Not on file  . Number of children: 0  . Years of education: 90  . Highest education level: High school graduate  Occupational History  . Occupation: Student    Comment: [employer] thinking about Biology  Social Needs  . Financial resource strain: Not hard at all  . Food insecurity:    Worry: Never true    Inability: Never true  . Transportation needs:    Medical: No    Non-medical: No  Tobacco Use  . Smoking status: Never Smoker  . Smokeless tobacco: Never Used  Substance and Sexual Activity  . Alcohol use: No  . Drug use: No  . Sexual activity: Never  Lifestyle  . Physical activity:    Days per week: 0 days    Minutes per session: Not on file  . Stress: Not at all  Relationships  . Social connections:    Talks on phone: More than three times a week    Gets together: More than three times a week    Attends religious service: Never    Active member of club or organization: No    Attends meetings of clubs or organizations: Never    Relationship status: Never married  . Intimate partner violence:    Fear of current or ex partner: No     Emotionally abused: No    Physically abused: No    Forced sexual activity: No  Other Topics Concern  . Not on file  Social History Narrative   Fun/Hobby: Video games   :  Pertinent items are noted in HPI.  Exam:  Blood pressure 107/68, pulse 85, temperature 98.2 F (36.8 C), temperature source Oral, resp. rate (!) 1, height 5' 9"  (1.753 m), weight 124 lb 3.2 oz (56.3 kg), SpO2 100 %. ECOG 0  General appearance:  alert and cooperative appeared without distress. Head: atraumatic without any abnormalities. Eyes: conjunctivae/corneas clear. PERRL.  Sclera anicteric. Throat: lips, mucosa, and tongue normal; without oral thrush or ulcers. Resp: clear to auscultation bilaterally without rhonchi, wheezes or dullness to percussion. Cardio: regular rate and rhythm, S1, S2 normal, no murmur, click, rub or gallop GI: soft, non-tender; bowel sounds normal; no masses,  no organomegaly Skin: Skin color, texture, turgor normal. No rashes or lesions Lymph nodes: Cervical, supraclavicular, and axillary nodes normal. Neurologic: Grossly normal without any motor, sensory or deep tendon reflexes. Musculoskeletal: No joint deformity or effusion.  Psychiatric: Affect slightly flat but appropriate.  CBC    Component Value Date/Time   WBC 7.3 10/06/2017 1518   RBC 4.90 10/06/2017 1518   HGB 14.3 11/01/2017 0703   HCT 45.5 10/06/2017 1518   PLT 263 10/06/2017 1518   MCV 92.7 10/06/2017 1518   MCH 31.0 10/06/2017 1518   MCHC 33.5 10/06/2017 1518   RDW 12.5 10/06/2017 1518   LYMPHSABS 1.5 04/25/2014 1118   MONOABS 0.3 04/25/2014 1118   EOSABS 0.1 04/25/2014 1118   BASOSABS 0.1 04/25/2014 1118     Chemistry      Component Value Date/Time   NA 139 10/06/2017 1518   K 4.1 10/06/2017 1518   CL 101 10/06/2017 1518   CO2 30 10/06/2017 1518   BUN 10 10/06/2017 1518   CREATININE 0.73 10/06/2017 1518      Component Value Date/Time   CALCIUM 9.7 10/06/2017 1518   ALKPHOS 91 04/25/2014 1118   AST  24 04/25/2014 1118   ALT 17 04/25/2014 1118   BILITOT 0.6 04/25/2014 1118       Assessment and Plan:   22 year old man with the following issues:  1.  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  He was found to have a 5.2 cm tumor of his left testicle and an elevated alpha-fetoprotein and beta-hCG.  He underwent an orchiectomy on November 01, 2017 which showed a 70% embryonal tumor and 30% yolk sac.  Focal lymphovascular invasion was detected and negative margins.  CT scan showed no evidence of metastatic disease and tumor markers normalized in April 2019.  This final clinical stage is T2N0.  The natural course of this disease was discussed today with the patient in detail.  He is status post orchiectomy which is curative for stage I seminoma the majority of time.  His risk of relapse is higher however than most stage I disease.  His lymphovascular invasion as well as predominant embryonal carcinoma puts him at risk of relapse somewhere between 40 to 50%.  Options of therapy were reviewed today which include active surveillance, retroperitoneal lymph node dissection or adjuvant chemotherapy.  The rationale for using these options as well as risks and benefits were weighed carefully.  The rationale for using 1 cycle of BEP chemotherapy was discussed today.  Complication associated with this therapy includes nausea, vomiting, myelosuppression, neutropenia, neutropenic sepsis, pulmonary toxicity associated with bleomycin, infusion related complications and rarely severe thrombosis, kidney damage and death.  The benefit of adjuvant chemotherapy which include the risk of relapse close to 1 to 2%.  He understands even with risk of relapse is high, his disease can still be salvaged effectively at that time.  After discussion today, he is leaning towards active surveillance but will meet with Dr. Tresa Moore to discuss retroperitoneal lymph node dissection also will think about  chemotherapy option as well.  Will let me know in the future  if he is interested to receiving chemotherapy.  2.  Pulmonary consideration: He will require pulmonary function test before starting bleomycin.  3.  Fertility issues: There is a risk associated with 1 cycle BAP of infertility.  Sturm banking will be a consideration if he opted to proceed with this therapy.  4.  IV access: Peripheral veins can be used Port-A-Cath can be deferred at this time.  5.  Antiemetics: Prescription for Compazine will be made available to this patient feels like to proceed with chemotherapy.  6.  Follow-up: To be determined at this time.  Depending on his decision whether to proceed with chemotherapy or not.  80  minutes was spent with the patient face-to-face today.  More than 50% of time was dedicated to patient counseling, education and answering questions regarding diagnosis, prognosis and different options of treatment.

## 2018-01-08 ENCOUNTER — Other Ambulatory Visit: Payer: Self-pay | Admitting: *Deleted

## 2018-01-08 ENCOUNTER — Telehealth: Payer: Self-pay

## 2018-01-08 DIAGNOSIS — C6292 Malignant neoplasm of left testis, unspecified whether descended or undescended: Secondary | ICD-10-CM | POA: Insufficient documentation

## 2018-01-08 MED ORDER — PROCHLORPERAZINE MALEATE 10 MG PO TABS: 10.0000 mg | ORAL_TABLET | Freq: Once | ORAL | 0 refills | Status: AC

## 2018-01-08 NOTE — Progress Notes (Signed)
START ON PATHWAY REGIMEN - Testicular     A cycle is every 21 days:     Bleomycin      Etoposide      Cisplatin   **Always confirm dose/schedule in your pharmacy ordering system**    Patient Characteristics: Nonseminoma, Newly Diagnosed, Stage I, Not a Candidate for Surveillance AJCC T Category: pT2 AJCC N Category: cN0 AJCC M Category: M0 AJCC S Category Post-Orchiectomy: S0 AJCC 8 Stage Grouping: IB Current evidence of distant metastases<= No Histology: Nonseminoma  Intent of Therapy: Curative Intent, Discussed with Patient

## 2018-01-08 NOTE — Telephone Encounter (Signed)
Per 5/3 no los °

## 2018-01-08 NOTE — Addendum Note (Signed)
Addended by: Wyatt Portela on: 01/08/2018 10:25 AM   Modules accepted: Orders

## 2018-01-09 ENCOUNTER — Telehealth: Payer: Self-pay | Admitting: Oncology

## 2018-01-09 ENCOUNTER — Telehealth: Payer: Self-pay | Admitting: *Deleted

## 2018-01-09 ENCOUNTER — Encounter: Payer: Self-pay | Admitting: *Deleted

## 2018-01-09 NOTE — Telephone Encounter (Signed)
Scheduled appt per 5/6 sch msg - spoke with pt's mother re appts

## 2018-01-09 NOTE — Telephone Encounter (Signed)
Call received from patient's mother in reference to "scheduling pulmonary function test before chemotherapy.  I need to speak with Dr. Hazeline Junker nurse.  The education class is all that's scheduled'.  Call transferred to collaaborative to help with this request.  Now aware PFT request has been submitted to be performed at [facility].

## 2018-01-09 NOTE — Telephone Encounter (Signed)
Order in chart for PFT, called pulmonary function and left message to call me when patient was scheduled.

## 2018-01-10 ENCOUNTER — Ambulatory Visit: Payer: 59 | Admitting: Psychology

## 2018-01-11 ENCOUNTER — Inpatient Hospital Stay: Payer: 59

## 2018-01-11 ENCOUNTER — Ambulatory Visit (HOSPITAL_COMMUNITY)
Admission: RE | Admit: 2018-01-11 | Discharge: 2018-01-11 | Disposition: A | Discharge status: Home / Self Care | Payer: 59 | Source: Ambulatory Visit | Attending: Oncology | Admitting: Oncology

## 2018-01-11 DIAGNOSIS — C6292 Malignant neoplasm of left testis, unspecified whether descended or undescended: Secondary | ICD-10-CM | POA: Insufficient documentation

## 2018-01-11 LAB — PULMONARY FUNCTION TEST
DL/VA % pred: 104 %
DL/VA: 4.87 ml/min/mmHg/L
DLCO UNC % PRED: 96 %
DLCO UNC: 29.99 ml/min/mmHg
FEF 25-75 Pre: 5.72 L/sec
FEF2575-%Pred-Pre: 117 %
FEV1-%Pred-Pre: 88 %
FEV1-Pre: 4.01 L
FEV1FVC-%PRED-PRE: 115 %
FEV6-%PRED-PRE: 76 %
FEV6-Pre: 4.14 L
FEV6FVC-%Pred-Pre: 100 %
FVC-%Pred-Pre: 76 %
FVC-Pre: 4.14 L
PRE FEV6/FVC RATIO: 100 %
Pre FEV1/FVC ratio: 97 %
RV % PRED: 102 %
RV: 1.39 L
TLC % PRED: 88 %
TLC: 5.87 L

## 2018-01-12 ENCOUNTER — Other Ambulatory Visit: Payer: Self-pay

## 2018-01-15 ENCOUNTER — Other Ambulatory Visit: Payer: Self-pay | Admitting: Hematology

## 2018-01-15 ENCOUNTER — Inpatient Hospital Stay: Payer: 59

## 2018-01-15 ENCOUNTER — Inpatient Hospital Stay (HOSPITAL_BASED_OUTPATIENT_CLINIC_OR_DEPARTMENT_OTHER): Payer: 59 | Admitting: Medical

## 2018-01-15 ENCOUNTER — Other Ambulatory Visit: Payer: Self-pay | Admitting: Medical

## 2018-01-15 VITALS — BP 105/65 | HR 63 | Resp 18

## 2018-01-15 DIAGNOSIS — T8090XA Unspecified complication following infusion and therapeutic injection, initial encounter: Secondary | ICD-10-CM | POA: Diagnosis not present

## 2018-01-15 DIAGNOSIS — C6292 Malignant neoplasm of left testis, unspecified whether descended or undescended: Secondary | ICD-10-CM

## 2018-01-15 DIAGNOSIS — R112 Nausea with vomiting, unspecified: Secondary | ICD-10-CM

## 2018-01-15 DIAGNOSIS — Z8042 Family history of malignant neoplasm of prostate: Secondary | ICD-10-CM | POA: Diagnosis not present

## 2018-01-15 DIAGNOSIS — R110 Nausea: Secondary | ICD-10-CM | POA: Diagnosis not present

## 2018-01-15 DIAGNOSIS — Z5111 Encounter for antineoplastic chemotherapy: Secondary | ICD-10-CM | POA: Diagnosis not present

## 2018-01-15 DIAGNOSIS — Z800 Family history of malignant neoplasm of digestive organs: Secondary | ICD-10-CM | POA: Diagnosis not present

## 2018-01-15 DIAGNOSIS — F418 Other specified anxiety disorders: Secondary | ICD-10-CM | POA: Diagnosis not present

## 2018-01-15 DIAGNOSIS — Z803 Family history of malignant neoplasm of breast: Secondary | ICD-10-CM | POA: Diagnosis not present

## 2018-01-15 DIAGNOSIS — R451 Restlessness and agitation: Secondary | ICD-10-CM | POA: Diagnosis not present

## 2018-01-15 DIAGNOSIS — Z806 Family history of leukemia: Secondary | ICD-10-CM | POA: Diagnosis not present

## 2018-01-15 DIAGNOSIS — Z79899 Other long term (current) drug therapy: Secondary | ICD-10-CM | POA: Diagnosis not present

## 2018-01-15 LAB — CMP (CANCER CENTER ONLY)
ALBUMIN: 4.6 g/dL (ref 3.5–5.0)
ALK PHOS: 66 U/L (ref 40–150)
ALT: 25 U/L (ref 0–55)
ANION GAP: 7 (ref 3–11)
AST: 19 U/L (ref 5–34)
BUN: 12 mg/dL (ref 7–26)
CALCIUM: 9.6 mg/dL (ref 8.4–10.4)
CO2: 28 mmol/L (ref 22–29)
CREATININE: 0.82 mg/dL (ref 0.70–1.30)
Chloride: 104 mmol/L (ref 98–109)
GFR, Est AFR Am: >60 mL/min (ref >60)
GFR, Estimated: >60 mL/min (ref >60)
GLUCOSE: 74 mg/dL (ref 70–140)
Potassium: 3.9 mmol/L (ref 3.5–5.1)
Sodium: 139 mmol/L (ref 136–145)
Total Bilirubin: 0.4 mg/dL (ref 0.2–1.2)
Total Protein: 7.7 g/dL (ref 6.4–8.3)

## 2018-01-15 LAB — LACTATE DEHYDROGENASE: LDH: 145 U/L (ref 125–245)

## 2018-01-15 LAB — CBC WITH DIFFERENTIAL (CANCER CENTER ONLY)
BASOS ABS: 0.1 10*3/uL (ref 0.0–0.1)
Basophils Relative: 2 %
EOS PCT: 4 %
Eosinophils Absolute: 0.1 10*3/uL (ref 0.0–0.5)
HCT: 41.1 % (ref 38.4–49.9)
Hemoglobin: 14 g/dL (ref 13.0–17.1)
LYMPHS PCT: 39 %
Lymphs Abs: 1.4 10*3/uL (ref 0.9–3.3)
MCH: 30.8 pg (ref 27.2–33.4)
MCHC: 34 g/dL (ref 32.0–36.0)
MCV: 90.6 fL (ref 79.3–98.0)
Monocytes Absolute: 0.4 10*3/uL (ref 0.1–0.9)
Monocytes Relative: 10 %
NEUTROS ABS: 1.7 10*3/uL (ref 1.5–6.5)
NEUTROS PCT: 45 %
PLATELETS: 246 10*3/uL (ref 140–400)
RBC: 4.54 MIL/uL (ref 4.20–5.82)
RDW: 12.3 % (ref 11.0–14.6)
WBC Count: 3.7 10*3/uL — ABNORMAL LOW (ref 4.0–10.3)

## 2018-01-15 MED ORDER — SODIUM CHLORIDE 0.9 % IV SOLN
12.0000 mg | Freq: Once | INTRAVENOUS | Status: AC
  Administered 2018-01-15: 12 mg via INTRAVENOUS
  Filled 2018-01-15: qty 1.2

## 2018-01-15 MED ORDER — METHYLPREDNISOLONE SODIUM SUCC 125 MG IJ SOLR
125.0000 mg | Freq: Once | INTRAMUSCULAR | Status: AC
  Administered 2018-01-15: 125 mg via INTRAVENOUS

## 2018-01-15 MED ORDER — PALONOSETRON HCL INJECTION 0.25 MG/5ML
0.2500 mg | Freq: Once | INTRAVENOUS | Status: AC
  Administered 2018-01-15: 0.25 mg via INTRAVENOUS

## 2018-01-15 MED ORDER — SODIUM CHLORIDE 0.9 % IV SOLN
Freq: Once | INTRAVENOUS | Status: AC
  Administered 2018-01-15: 10:00:00 via INTRAVENOUS

## 2018-01-15 MED ORDER — SODIUM CHLORIDE 0.9 % IV SOLN
100.0000 mg/m2 | Freq: Once | INTRAVENOUS | Status: AC
  Administered 2018-01-15: 170 mg via INTRAVENOUS
  Filled 2018-01-15: qty 8.5

## 2018-01-15 MED ORDER — PALONOSETRON HCL INJECTION 0.25 MG/5ML
INTRAVENOUS | Status: AC
  Filled 2018-01-15: qty 5

## 2018-01-15 MED ORDER — POTASSIUM CHLORIDE 2 MEQ/ML IV SOLN
Freq: Once | INTRAVENOUS | Status: AC
  Administered 2018-01-15: 11:00:00 via INTRAVENOUS
  Filled 2018-01-15: qty 10

## 2018-01-15 MED ORDER — SODIUM CHLORIDE 0.9 % IV SOLN
Freq: Once | INTRAVENOUS | Status: AC
  Administered 2018-01-15: 13:00:00 via INTRAVENOUS
  Filled 2018-01-15: qty 5

## 2018-01-15 MED ORDER — OLANZAPINE 5 MG PO TABS: 5.0000 mg | ORAL_TABLET | Freq: Every day | ORAL | 1 refills | Status: AC

## 2018-01-15 MED ORDER — SODIUM CHLORIDE 0.9 % IV SOLN
20.0000 mg/m2 | Freq: Once | INTRAVENOUS | Status: AC
  Administered 2018-01-15: 33 mg via INTRAVENOUS
  Filled 2018-01-15: qty 33

## 2018-01-15 NOTE — Progress Notes (Signed)
1257-Pt coughing and reports SOB and difficulty breathing. Emend/dec running. Hypersensitivity protocol initiated. VSS. Meds given per mar. 1310-Pt reports feeling back to normal besides for some "shakiness."

## 2018-01-15 NOTE — Patient Instructions (Signed)
[facility] Discharge Instructions for Patients Receiving Chemotherapy  Today you received the following chemotherapy agents: Cisplatin and Etoposide.  To help prevent nausea and vomiting after your treatment, we encourage you to take your nausea medication as prescribed.   If you develop nausea and vomiting that is not controlled by your nausea medication, call the clinic.   BELOW ARE SYMPTOMS THAT SHOULD BE REPORTED IMMEDIATELY:  *FEVER GREATER THAN 100.5 F  *CHILLS WITH OR WITHOUT FEVER  NAUSEA AND VOMITING THAT IS NOT CONTROLLED WITH YOUR NAUSEA MEDICATION  *UNUSUAL SHORTNESS OF BREATH  *UNUSUAL BRUISING OR BLEEDING  TENDERNESS IN MOUTH AND THROAT WITH OR WITHOUT PRESENCE OF ULCERS  *URINARY PROBLEMS  *BOWEL PROBLEMS  UNUSUAL RASH Items with * indicate a potential emergency and should be followed up as soon as possible.  Feel free to call the clinic should you have any questions or concerns. The clinic phone number is [phone].  Please show the [facility] at check-in to the Emergency Department and triage nurse.  Cisplatin injection What is this medicine? CISPLATIN (SIS pla tin) is a chemotherapy drug. It targets fast dividing cells, like cancer cells, and causes these cells to die. This medicine is used to treat many types of cancer like bladder, ovarian, and testicular cancers. This medicine may be used for other purposes; ask your health care provider or pharmacist if you have questions. COMMON BRAND NAME(S): Platinol, Platinol -AQ What should I tell my health care provider before I take this medicine? They need to know if you have any of these conditions: -blood disorders -hearing problems -kidney disease -recent or ongoing radiation therapy -an unusual or allergic reaction to cisplatin, carboplatin, other chemotherapy, other medicines, foods, dyes, or preservatives -pregnant or trying to get pregnant -breast-feeding How should I use  this medicine? This drug is given as an infusion into a vein. It is administered in a hospital or clinic by a specially trained health care professional. Talk to your pediatrician regarding the use of this medicine in children. Special care may be needed. Overdosage: If you think you have taken too much of this medicine contact a poison control center or emergency room at once. NOTE: This medicine is only for you. Do not share this medicine with others. What if I miss a dose? It is important not to miss a dose. Call your doctor or health care professional if you are unable to keep an appointment. What may interact with this medicine? -dofetilide -foscarnet -medicines for seizures -medicines to increase blood counts like filgrastim, pegfilgrastim, sargramostim -probenecid -pyridoxine used with altretamine -rituximab -some antibiotics like amikacin, gentamicin, neomycin, polymyxin B, streptomycin, tobramycin -sulfinpyrazone -vaccines -zalcitabine Talk to your doctor or health care professional before taking any of these medicines: -acetaminophen -aspirin -ibuprofen -ketoprofen -naproxen This list may not describe all possible interactions. Give your health care provider a list of all the medicines, herbs, non-prescription drugs, or dietary supplements you use. Also tell them if you smoke, drink alcohol, or use illegal drugs. Some items may interact with your medicine. What should I watch for while using this medicine? Your condition will be monitored carefully while you are receiving this medicine. You will need important blood work done while you are taking this medicine. This drug may make you feel generally unwell. This is not uncommon, as chemotherapy can affect healthy cells as well as cancer cells. Report any side effects. Continue your course of treatment even though you feel ill unless your doctor tells you  to stop. In some cases, you may be given additional medicines to help with  side effects. Follow all directions for their use. Call your doctor or health care professional for advice if you get a fever, chills or sore throat, or other symptoms of a cold or flu. Do not treat yourself. This drug decreases your body's ability to fight infections. Try to avoid being around people who are sick. This medicine may increase your risk to bruise or bleed. Call your doctor or health care professional if you notice any unusual bleeding. Be careful brushing and flossing your teeth or using a toothpick because you may get an infection or bleed more easily. If you have any dental work done, tell your dentist you are receiving this medicine. Avoid taking products that contain aspirin, acetaminophen, ibuprofen, naproxen, or ketoprofen unless instructed by your doctor. These medicines may hide a fever. Do not become pregnant while taking this medicine. Women should inform their doctor if they wish to become pregnant or think they might be pregnant. There is a potential for serious side effects to an unborn child. Talk to your health care professional or pharmacist for more information. Do not breast-feed an infant while taking this medicine. Drink fluids as directed while you are taking this medicine. This will help protect your kidneys. Call your doctor or health care professional if you get diarrhea. Do not treat yourself. What side effects may I notice from receiving this medicine? Side effects that you should report to your doctor or health care professional as soon as possible: -allergic reactions like skin rash, itching or hives, swelling of the face, lips, or tongue -signs of infection - fever or chills, cough, sore throat, pain or difficulty passing urine -signs of decreased platelets or bleeding - bruising, pinpoint red spots on the skin, black, tarry stools, nosebleeds -signs of decreased red blood cells - unusually weak or tired, fainting spells, lightheadedness -breathing  problems -changes in hearing -gout pain -low blood counts - This drug may decrease the number of white blood cells, red blood cells and platelets. You may be at increased risk for infections and bleeding. -nausea and vomiting -pain, swelling, redness or irritation at the injection site -pain, tingling, numbness in the hands or feet -problems with balance, movement -trouble passing urine or change in the amount of urine Side effects that usually do not require medical attention (report to your doctor or health care professional if they continue or are bothersome): -changes in vision -loss of appetite -metallic taste in the mouth or changes in taste This list may not describe all possible side effects. Call your doctor for medical advice about side effects. You may report side effects to FDA at 1-800-FDA-1088. Where should I keep my medicine? This drug is given in a hospital or clinic and will not be stored at home. NOTE: This sheet is a summary. It may not cover all possible information. If you have questions about this medicine, talk to your doctor, pharmacist, or health care provider.  2018 Elsevier/Gold Standard (2007-11-27 14:40:54)

## 2018-01-15 NOTE — Progress Notes (Signed)
   DATE: 01/15/2018      X CHEMO/IMMUNOTHERAPY REACTION         MD: Alen Blew     AGENT/BLOOD PRODUCT RECEIVING TODAY:   Cisplatin and etoposide  AGENT/BLOOD PRODUCT RECEIVING IMMEDIATELY PRIOR TO REACTION: Emend (The patient had been premedicated with Aloxi 0.25 mg IV without adverse effects.)  VS: BP:     101/70 P:       79 SPO2:       100        T:       97.7   REACTION(S):  Shortness of breath, cough, and brief chest pain  PREMEDS:  Aloxi 0.25 mg IV and Emend (less than 5 minutes of infusion)  INTERVENTION:  Solu-Medrodrol 125 mg IV x1   Review of Systems  Constitutional: Negative for chills and diaphoresis.  HENT: Negative for congestion, facial swelling and trouble swallowing.   Respiratory: Positive for cough and shortness of breath. Negative for choking, chest tightness and wheezing.   Cardiovascular: Positive for chest pain. Negative for palpitations.  Gastrointestinal: Negative for nausea and vomiting.  Musculoskeletal: Negative for back pain.  Skin: Negative for rash.  Neurological: Negative for dizziness, speech difficulty and headaches.  Psychiatric/Behavioral: The patient is not nervous/anxious.     Physical Exam  Constitutional: No distress.  HENT:  Head: Normocephalic and atraumatic.  Cardiovascular: Normal rate, regular rhythm and normal heart sounds. Exam reveals no gallop and no friction rub.  No murmur heard. Pulmonary/Chest: Effort normal and breath sounds normal. No respiratory distress. He has no wheezes. He has no rales.  Neurological: He is alert.  Skin: Skin is warm and dry. No rash noted. He is not diaphoretic. No erythema.     OUTCOME:   This was discussed at length with pharmacy.  The following recommendations were given based on NCCN guidelines:     1)  Zyprexa 5 to 10 mg once daily    2) Aloxi 0.25 mg IV every other day with chemotherapy    3) Decadron 12 mg IV daily with chemotherapy  A prescription for Zyprexa 5 mg with directions for 1  to 2 tablets at bedtime was sent to the patient's pharmacy.  Additionally the patient was given Decadron 12 mg IV.  He has a prescription at home for Compazine to use as needed.  The patient chemotherapy was begun after 30 minutes following receipt of Decadron.  Sandi Mealy, MHS, PA-C

## 2018-01-16 ENCOUNTER — Inpatient Hospital Stay: Payer: 59

## 2018-01-16 ENCOUNTER — Encounter: Payer: Self-pay | Admitting: Oncology

## 2018-01-16 VITALS — BP 110/66 | HR 95 | Temp 97.8°F | Resp 16

## 2018-01-16 DIAGNOSIS — C6292 Malignant neoplasm of left testis, unspecified whether descended or undescended: Secondary | ICD-10-CM

## 2018-01-16 LAB — BETA HCG QUANT (REF LAB): hCG Quant: <1 m[IU]/mL (ref 0–3)

## 2018-01-16 LAB — AFP TUMOR MARKER: AFP, Serum, Tumor Marker: 0.9 ng/mL (ref 0.0–8.3)

## 2018-01-16 MED ORDER — SODIUM CHLORIDE 0.9 % IV SOLN
20.0000 mg/m2 | Freq: Once | INTRAVENOUS | Status: AC
  Administered 2018-01-16: 33 mg via INTRAVENOUS
  Filled 2018-01-16: qty 33

## 2018-01-16 MED ORDER — SODIUM CHLORIDE 0.9 % IV SOLN
30.0000 [IU] | Freq: Once | INTRAVENOUS | Status: AC
  Administered 2018-01-16: 30 [IU] via INTRAVENOUS
  Filled 2018-01-16: qty 10

## 2018-01-16 MED ORDER — SODIUM CHLORIDE 0.9 % IV SOLN
100.0000 mg/m2 | Freq: Once | INTRAVENOUS | Status: AC
  Administered 2018-01-16: 170 mg via INTRAVENOUS
  Filled 2018-01-16: qty 8.5

## 2018-01-16 MED ORDER — SODIUM CHLORIDE 0.9 % IV SOLN
12.0000 mg | Freq: Once | INTRAVENOUS | Status: AC
  Administered 2018-01-16: 12 mg via INTRAVENOUS
  Filled 2018-01-16: qty 1.2

## 2018-01-16 MED ORDER — POTASSIUM CHLORIDE 2 MEQ/ML IV SOLN
Freq: Once | INTRAVENOUS | Status: AC
  Administered 2018-01-16: 11:00:00 via INTRAVENOUS
  Filled 2018-01-16: qty 10

## 2018-01-16 MED ORDER — SODIUM CHLORIDE 0.9 % IV SOLN: Freq: Once | INTRAVENOUS | Status: DC

## 2018-01-16 NOTE — Progress Notes (Signed)
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  Unfortunately there aren't any foundations offering copay assistance for his Dx.  I informed her on the [place], went over what it covers and gave her an expense sheet.  Pt is not working so she provided a letter of support so pt is approved for the $400 [employer].  She has my card for any questions or concerns she may have in the future.

## 2018-01-16 NOTE — Progress Notes (Signed)
Pt voided 550 output. Chemo releasesd

## 2018-01-16 NOTE — Patient Instructions (Signed)
[facility] Discharge Instructions for Patients Receiving Chemotherapy  Today you received the following chemotherapy agents: Cisplatin and Etoposide, bleomycin,   To help prevent nausea and vomiting after your treatment, we encourage you to take your nausea medication as prescribed.   If you develop nausea and vomiting that is not controlled by your nausea medication, call the clinic.   BELOW ARE SYMPTOMS THAT SHOULD BE REPORTED IMMEDIATELY:  *FEVER GREATER THAN 100.5 F  *CHILLS WITH OR WITHOUT FEVER  NAUSEA AND VOMITING THAT IS NOT CONTROLLED WITH YOUR NAUSEA MEDICATION  *UNUSUAL SHORTNESS OF BREATH  *UNUSUAL BRUISING OR BLEEDING  TENDERNESS IN MOUTH AND THROAT WITH OR WITHOUT PRESENCE OF ULCERS  *URINARY PROBLEMS  *BOWEL PROBLEMS  UNUSUAL RASH Items with * indicate a potential emergency and should be followed up as soon as possible.  Feel free to call the clinic should you have any questions or concerns. The clinic phone number is [phone].  Please show the [facility] at check-in to the Emergency Department and triage nurse.  Cisplatin injection What is this medicine? CISPLATIN (SIS pla tin) is a chemotherapy drug. It targets fast dividing cells, like cancer cells, and causes these cells to die. This medicine is used to treat many types of cancer like bladder, ovarian, and testicular cancers. This medicine may be used for other purposes; ask your health care provider or pharmacist if you have questions. COMMON BRAND NAME(S): Platinol, Platinol -AQ What should I tell my health care provider before I take this medicine? They need to know if you have any of these conditions: -blood disorders -hearing problems -kidney disease -recent or ongoing radiation therapy -an unusual or allergic reaction to cisplatin, carboplatin, other chemotherapy, other medicines, foods, dyes, or preservatives -pregnant or trying to get pregnant -breast-feeding How  should I use this medicine? This drug is given as an infusion into a vein. It is administered in a hospital or clinic by a specially trained health care professional. Talk to your pediatrician regarding the use of this medicine in children. Special care may be needed. Overdosage: If you think you have taken too much of this medicine contact a poison control center or emergency room at once. NOTE: This medicine is only for you. Do not share this medicine with others. What if I miss a dose? It is important not to miss a dose. Call your doctor or health care professional if you are unable to keep an appointment. What may interact with this medicine? -dofetilide -foscarnet -medicines for seizures -medicines to increase blood counts like filgrastim, pegfilgrastim, sargramostim -probenecid -pyridoxine used with altretamine -rituximab -some antibiotics like amikacin, gentamicin, neomycin, polymyxin B, streptomycin, tobramycin -sulfinpyrazone -vaccines -zalcitabine Talk to your doctor or health care professional before taking any of these medicines: -acetaminophen -aspirin -ibuprofen -ketoprofen -naproxen This list may not describe all possible interactions. Give your health care provider a list of all the medicines, herbs, non-prescription drugs, or dietary supplements you use. Also tell them if you smoke, drink alcohol, or use illegal drugs. Some items may interact with your medicine. What should I watch for while using this medicine? Your condition will be monitored carefully while you are receiving this medicine. You will need important blood work done while you are taking this medicine. This drug may make you feel generally unwell. This is not uncommon, as chemotherapy can affect healthy cells as well as cancer cells. Report any side effects. Continue your course of treatment even though you feel ill unless your doctor  tells you to stop. In some cases, you may be given additional medicines  to help with side effects. Follow all directions for their use. Call your doctor or health care professional for advice if you get a fever, chills or sore throat, or other symptoms of a cold or flu. Do not treat yourself. This drug decreases your body's ability to fight infections. Try to avoid being around people who are sick. This medicine may increase your risk to bruise or bleed. Call your doctor or health care professional if you notice any unusual bleeding. Be careful brushing and flossing your teeth or using a toothpick because you may get an infection or bleed more easily. If you have any dental work done, tell your dentist you are receiving this medicine. Avoid taking products that contain aspirin, acetaminophen, ibuprofen, naproxen, or ketoprofen unless instructed by your doctor. These medicines may hide a fever. Do not become pregnant while taking this medicine. Women should inform their doctor if they wish to become pregnant or think they might be pregnant. There is a potential for serious side effects to an unborn child. Talk to your health care professional or pharmacist for more information. Do not breast-feed an infant while taking this medicine. Drink fluids as directed while you are taking this medicine. This will help protect your kidneys. Call your doctor or health care professional if you get diarrhea. Do not treat yourself. What side effects may I notice from receiving this medicine? Side effects that you should report to your doctor or health care professional as soon as possible: -allergic reactions like skin rash, itching or hives, swelling of the face, lips, or tongue -signs of infection - fever or chills, cough, sore throat, pain or difficulty passing urine -signs of decreased platelets or bleeding - bruising, pinpoint red spots on the skin, black, tarry stools, nosebleeds -signs of decreased red blood cells - unusually weak or tired, fainting spells,  lightheadedness -breathing problems -changes in hearing -gout pain -low blood counts - This drug may decrease the number of white blood cells, red blood cells and platelets. You may be at increased risk for infections and bleeding. -nausea and vomiting -pain, swelling, redness or irritation at the injection site -pain, tingling, numbness in the hands or feet -problems with balance, movement -trouble passing urine or change in the amount of urine Side effects that usually do not require medical attention (report to your doctor or health care professional if they continue or are bothersome): -changes in vision -loss of appetite -metallic taste in the mouth or changes in taste This list may not describe all possible side effects. Call your doctor for medical advice about side effects. You may report side effects to FDA at 1-800-FDA-1088. Where should I keep my medicine? This drug is given in a hospital or clinic and will not be stored at home. NOTE: This sheet is a summary. It may not cover all possible information. If you have questions about this medicine, talk to your doctor, pharmacist, or health care provider.  2018 Elsevier/Gold Standard (2007-11-27 14:40:54)

## 2018-01-17 ENCOUNTER — Inpatient Hospital Stay: Payer: 59

## 2018-01-17 VITALS — BP 121/69 | HR 75 | Temp 97.9°F | Resp 16

## 2018-01-17 DIAGNOSIS — C6292 Malignant neoplasm of left testis, unspecified whether descended or undescended: Secondary | ICD-10-CM | POA: Diagnosis not present

## 2018-01-17 MED ORDER — DEXAMETHASONE SODIUM PHOSPHATE 100 MG/10ML IJ SOLN
12.0000 mg | Freq: Once | INTRAMUSCULAR | Status: AC
  Administered 2018-01-17: 12 mg via INTRAVENOUS
  Filled 2018-01-17: qty 1.2

## 2018-01-17 MED ORDER — POTASSIUM CHLORIDE 2 MEQ/ML IV SOLN
Freq: Once | INTRAVENOUS | Status: AC
  Administered 2018-01-17: 11:00:00 via INTRAVENOUS
  Filled 2018-01-17: qty 10

## 2018-01-17 MED ORDER — SODIUM CHLORIDE 0.9 % IV SOLN
Freq: Once | INTRAVENOUS | Status: AC
  Administered 2018-01-17: 10:00:00 via INTRAVENOUS

## 2018-01-17 MED ORDER — LORAZEPAM 2 MG/ML IJ SOLN
0.5000 mg | Freq: Once | INTRAMUSCULAR | Status: AC
  Administered 2018-01-17: 0.5 mg via INTRAVENOUS

## 2018-01-17 MED ORDER — SODIUM CHLORIDE 0.9 % IV SOLN
100.0000 mg/m2 | Freq: Once | INTRAVENOUS | Status: AC
  Administered 2018-01-17: 170 mg via INTRAVENOUS
  Filled 2018-01-17: qty 8.5

## 2018-01-17 MED ORDER — PALONOSETRON HCL INJECTION 0.25 MG/5ML
0.2500 mg | Freq: Once | INTRAVENOUS | Status: AC
  Administered 2018-01-17: 0.25 mg via INTRAVENOUS

## 2018-01-17 MED ORDER — SODIUM CHLORIDE 0.9 % IV SOLN
20.0000 mg/m2 | Freq: Once | INTRAVENOUS | Status: AC
  Administered 2018-01-17: 33 mg via INTRAVENOUS
  Filled 2018-01-17: qty 33

## 2018-01-17 MED ORDER — PALONOSETRON HCL INJECTION 0.25 MG/5ML
INTRAVENOUS | Status: AC
  Filled 2018-01-17: qty 5

## 2018-01-17 MED ORDER — LORAZEPAM 2 MG/ML IJ SOLN
INTRAMUSCULAR | Status: AC
  Filled 2018-01-17: qty 1

## 2018-01-17 NOTE — Patient Instructions (Signed)
[facility] Discharge Instructions for Patients Receiving Chemotherapy  Today you received the following chemotherapy agents: Cisplatin and Etoposide  To help prevent nausea and vomiting after your treatment, we encourage you to take your nausea medication as prescribed.   If you develop nausea and vomiting that is not controlled by your nausea medication, call the clinic.   BELOW ARE SYMPTOMS THAT SHOULD BE REPORTED IMMEDIATELY:  *FEVER GREATER THAN 100.5 F  *CHILLS WITH OR WITHOUT FEVER  NAUSEA AND VOMITING THAT IS NOT CONTROLLED WITH YOUR NAUSEA MEDICATION  *UNUSUAL SHORTNESS OF BREATH  *UNUSUAL BRUISING OR BLEEDING  TENDERNESS IN MOUTH AND THROAT WITH OR WITHOUT PRESENCE OF ULCERS  *URINARY PROBLEMS  *BOWEL PROBLEMS  UNUSUAL RASH Items with * indicate a potential emergency and should be followed up as soon as possible.  Feel free to call the clinic should you have any questions or concerns. The clinic phone number is [phone].  Please show the [facility] at check-in to the Emergency Department and triage nurse.  Cisplatin injection What is this medicine? CISPLATIN (SIS pla tin) is a chemotherapy drug. It targets fast dividing cells, like cancer cells, and causes these cells to die. This medicine is used to treat many types of cancer like bladder, ovarian, and testicular cancers. This medicine may be used for other purposes; ask your health care provider or pharmacist if you have questions. COMMON BRAND NAME(S): Platinol, Platinol -AQ What should I tell my health care provider before I take this medicine? They need to know if you have any of these conditions: -blood disorders -hearing problems -kidney disease -recent or ongoing radiation therapy -an unusual or allergic reaction to cisplatin, carboplatin, other chemotherapy, other medicines, foods, dyes, or preservatives -pregnant or trying to get pregnant -breast-feeding How should I use  this medicine? This drug is given as an infusion into a vein. It is administered in a hospital or clinic by a specially trained health care professional. Talk to your pediatrician regarding the use of this medicine in children. Special care may be needed. Overdosage: If you think you have taken too much of this medicine contact a poison control center or emergency room at once. NOTE: This medicine is only for you. Do not share this medicine with others. What if I miss a dose? It is important not to miss a dose. Call your doctor or health care professional if you are unable to keep an appointment. What may interact with this medicine? -dofetilide -foscarnet -medicines for seizures -medicines to increase blood counts like filgrastim, pegfilgrastim, sargramostim -probenecid -pyridoxine used with altretamine -rituximab -some antibiotics like amikacin, gentamicin, neomycin, polymyxin B, streptomycin, tobramycin -sulfinpyrazone -vaccines -zalcitabine Talk to your doctor or health care professional before taking any of these medicines: -acetaminophen -aspirin -ibuprofen -ketoprofen -naproxen This list may not describe all possible interactions. Give your health care provider a list of all the medicines, herbs, non-prescription drugs, or dietary supplements you use. Also tell them if you smoke, drink alcohol, or use illegal drugs. Some items may interact with your medicine. What should I watch for while using this medicine? Your condition will be monitored carefully while you are receiving this medicine. You will need important blood work done while you are taking this medicine. This drug may make you feel generally unwell. This is not uncommon, as chemotherapy can affect healthy cells as well as cancer cells. Report any side effects. Continue your course of treatment even though you feel ill unless your doctor tells you  to stop. In some cases, you may be given additional medicines to help with  side effects. Follow all directions for their use. Call your doctor or health care professional for advice if you get a fever, chills or sore throat, or other symptoms of a cold or flu. Do not treat yourself. This drug decreases your body's ability to fight infections. Try to avoid being around people who are sick. This medicine may increase your risk to bruise or bleed. Call your doctor or health care professional if you notice any unusual bleeding. Be careful brushing and flossing your teeth or using a toothpick because you may get an infection or bleed more easily. If you have any dental work done, tell your dentist you are receiving this medicine. Avoid taking products that contain aspirin, acetaminophen, ibuprofen, naproxen, or ketoprofen unless instructed by your doctor. These medicines may hide a fever. Do not become pregnant while taking this medicine. Women should inform their doctor if they wish to become pregnant or think they might be pregnant. There is a potential for serious side effects to an unborn child. Talk to your health care professional or pharmacist for more information. Do not breast-feed an infant while taking this medicine. Drink fluids as directed while you are taking this medicine. This will help protect your kidneys. Call your doctor or health care professional if you get diarrhea. Do not treat yourself. What side effects may I notice from receiving this medicine? Side effects that you should report to your doctor or health care professional as soon as possible: -allergic reactions like skin rash, itching or hives, swelling of the face, lips, or tongue -signs of infection - fever or chills, cough, sore throat, pain or difficulty passing urine -signs of decreased platelets or bleeding - bruising, pinpoint red spots on the skin, black, tarry stools, nosebleeds -signs of decreased red blood cells - unusually weak or tired, fainting spells, lightheadedness -breathing  problems -changes in hearing -gout pain -low blood counts - This drug may decrease the number of white blood cells, red blood cells and platelets. You may be at increased risk for infections and bleeding. -nausea and vomiting -pain, swelling, redness or irritation at the injection site -pain, tingling, numbness in the hands or feet -problems with balance, movement -trouble passing urine or change in the amount of urine Side effects that usually do not require medical attention (report to your doctor or health care professional if they continue or are bothersome): -changes in vision -loss of appetite -metallic taste in the mouth or changes in taste This list may not describe all possible side effects. Call your doctor for medical advice about side effects. You may report side effects to FDA at 1-800-FDA-1088. Where should I keep my medicine? This drug is given in a hospital or clinic and will not be stored at home. NOTE: This sheet is a summary. It may not cover all possible information. If you have questions about this medicine, talk to your doctor, pharmacist, or health care provider.  2018 Elsevier/Gold Standard (2007-11-27 14:40:54)

## 2018-01-17 NOTE — Progress Notes (Signed)
Patient and mother report increased jitteriness. Patient states it is like he is unable to control his body movements. Sandi Mealy, PA contacted and order was given for 0.5 mg Ativan IV.

## 2018-01-18 ENCOUNTER — Inpatient Hospital Stay: Payer: 59

## 2018-01-18 VITALS — BP 128/77 | HR 91 | Temp 99.0°F

## 2018-01-18 DIAGNOSIS — C6292 Malignant neoplasm of left testis, unspecified whether descended or undescended: Secondary | ICD-10-CM | POA: Diagnosis not present

## 2018-01-18 MED ORDER — SODIUM CHLORIDE 0.9% FLUSH
10.0000 mL | INTRAVENOUS | Status: DC | PRN
  Filled 2018-01-18: qty 10

## 2018-01-18 MED ORDER — SODIUM CHLORIDE 0.9 % IV SOLN
12.0000 mg | Freq: Once | INTRAVENOUS | Status: AC
  Administered 2018-01-18: 12 mg via INTRAVENOUS
  Filled 2018-01-18: qty 1.2

## 2018-01-18 MED ORDER — SODIUM CHLORIDE 0.9 % IV SOLN
20.0000 mg/m2 | Freq: Once | INTRAVENOUS | Status: AC
  Administered 2018-01-18: 33 mg via INTRAVENOUS
  Filled 2018-01-18: qty 33

## 2018-01-18 MED ORDER — SODIUM CHLORIDE 0.9 % IV SOLN
100.0000 mg/m2 | Freq: Once | INTRAVENOUS | Status: AC
  Administered 2018-01-18: 170 mg via INTRAVENOUS
  Filled 2018-01-18: qty 8.5

## 2018-01-18 MED ORDER — SODIUM CHLORIDE 0.9 % IV SOLN
Freq: Once | INTRAVENOUS | Status: AC
  Administered 2018-01-18: 10:00:00 via INTRAVENOUS

## 2018-01-18 MED ORDER — POTASSIUM CHLORIDE 2 MEQ/ML IV SOLN
Freq: Once | INTRAVENOUS | Status: AC
  Administered 2018-01-18: 10:00:00 via INTRAVENOUS
  Filled 2018-01-18: qty 10

## 2018-01-18 NOTE — Patient Instructions (Signed)
[facility] Discharge Instructions for Patients Receiving Chemotherapy  Today you received the following chemotherapy agents:  Cisplatin & VP 16  To help prevent nausea and vomiting after your treatment, we encourage you to take your nausea medication.   If you develop nausea and vomiting that is not controlled by your nausea medication, call the clinic.   BELOW ARE SYMPTOMS THAT SHOULD BE REPORTED IMMEDIATELY:  *FEVER GREATER THAN 100.5 F  *CHILLS WITH OR WITHOUT FEVER  NAUSEA AND VOMITING THAT IS NOT CONTROLLED WITH YOUR NAUSEA MEDICATION  *UNUSUAL SHORTNESS OF BREATH  *UNUSUAL BRUISING OR BLEEDING  TENDERNESS IN MOUTH AND THROAT WITH OR WITHOUT PRESENCE OF ULCERS  *URINARY PROBLEMS  *BOWEL PROBLEMS  UNUSUAL RASH Items with * indicate a potential emergency and should be followed up as soon as possible.  Feel free to call the clinic should you have any questions or concerns. The clinic phone number is [phone].  Please show the [place] at check-in to the Emergency Department and triage nurse.

## 2018-01-19 ENCOUNTER — Inpatient Hospital Stay: Payer: 59

## 2018-01-19 ENCOUNTER — Other Ambulatory Visit: Payer: Self-pay | Admitting: *Deleted

## 2018-01-19 VITALS — BP 116/71 | HR 79 | Temp 98.0°F | Resp 16

## 2018-01-19 DIAGNOSIS — C6292 Malignant neoplasm of left testis, unspecified whether descended or undescended: Secondary | ICD-10-CM

## 2018-01-19 MED ORDER — LORAZEPAM 2 MG/ML IJ SOLN
INTRAMUSCULAR | Status: AC
  Filled 2018-01-19: qty 1

## 2018-01-19 MED ORDER — LORAZEPAM 2 MG/ML IJ SOLN
0.5000 mg | Freq: Once | INTRAMUSCULAR | Status: AC
  Administered 2018-01-19: 0.5 mg via INTRAVENOUS

## 2018-01-19 MED ORDER — SODIUM CHLORIDE 0.9 % IV SOLN
Freq: Once | INTRAVENOUS | Status: AC
  Administered 2018-01-19: 10:00:00 via INTRAVENOUS

## 2018-01-19 MED ORDER — SODIUM CHLORIDE 0.9 % IV SOLN
12.0000 mg | Freq: Once | INTRAVENOUS | Status: AC
  Administered 2018-01-19: 12 mg via INTRAVENOUS
  Filled 2018-01-19: qty 1.2

## 2018-01-19 MED ORDER — PALONOSETRON HCL INJECTION 0.25 MG/5ML
0.2500 mg | Freq: Once | INTRAVENOUS | Status: AC
  Administered 2018-01-19: 0.25 mg via INTRAVENOUS

## 2018-01-19 MED ORDER — LORAZEPAM 1 MG PO TABS: 1.0000 mg | ORAL_TABLET | Freq: Three times a day (TID) | ORAL | 0 refills | Status: DC | PRN

## 2018-01-19 MED ORDER — DEXTROSE-NACL 5-0.45 % IV SOLN
Freq: Once | INTRAVENOUS | Status: AC
  Administered 2018-01-19: 10:00:00 via INTRAVENOUS
  Filled 2018-01-19: qty 10

## 2018-01-19 MED ORDER — ETOPOSIDE CHEMO INJECTION 1 GM/50ML
100.0000 mg/m2 | Freq: Once | INTRAVENOUS | Status: AC
  Administered 2018-01-19: 170 mg via INTRAVENOUS
  Filled 2018-01-19: qty 8.5

## 2018-01-19 MED ORDER — PALONOSETRON HCL INJECTION 0.25 MG/5ML
INTRAVENOUS | Status: AC
  Filled 2018-01-19: qty 5

## 2018-01-19 MED ORDER — SODIUM CHLORIDE 0.9 % IV SOLN
20.0000 mg/m2 | Freq: Once | INTRAVENOUS | Status: AC
  Administered 2018-01-19: 33 mg via INTRAVENOUS
  Filled 2018-01-19: qty 33

## 2018-01-19 NOTE — Patient Instructions (Addendum)
[facility] Discharge Instructions for Patients Receiving Chemotherapy  Today you received the following chemotherapy agents: Cisplatin and Etoposide.  To help prevent nausea and vomiting after your treatment, we encourage you to take your nausea medication as prescribed.   If you develop nausea and vomiting that is not controlled by your nausea medication, call the clinic.   BELOW ARE SYMPTOMS THAT SHOULD BE REPORTED IMMEDIATELY:  *FEVER GREATER THAN 100.5 F  *CHILLS WITH OR WITHOUT FEVER  NAUSEA AND VOMITING THAT IS NOT CONTROLLED WITH YOUR NAUSEA MEDICATION  *UNUSUAL SHORTNESS OF BREATH  *UNUSUAL BRUISING OR BLEEDING  TENDERNESS IN MOUTH AND THROAT WITH OR WITHOUT PRESENCE OF ULCERS  *URINARY PROBLEMS  *BOWEL PROBLEMS  UNUSUAL RASH Items with * indicate a potential emergency and should be followed up as soon as possible.  Feel free to call the clinic should you have any questions or concerns. The clinic phone number is [phone].  Please show the CHEMO ALERT CARD at check-in to the Emergency Department and triage nurse.   

## 2018-01-23 ENCOUNTER — Inpatient Hospital Stay: Payer: 59

## 2018-01-23 ENCOUNTER — Inpatient Hospital Stay (HOSPITAL_BASED_OUTPATIENT_CLINIC_OR_DEPARTMENT_OTHER): Payer: 59 | Admitting: Oncology

## 2018-01-23 VITALS — BP 111/72 | HR 96 | Temp 97.6°F | Resp 18 | Ht 69.0 in | Wt 123.5 lb

## 2018-01-23 DIAGNOSIS — Z79899 Other long term (current) drug therapy: Secondary | ICD-10-CM | POA: Diagnosis not present

## 2018-01-23 DIAGNOSIS — C6292 Malignant neoplasm of left testis, unspecified whether descended or undescended: Secondary | ICD-10-CM

## 2018-01-23 DIAGNOSIS — Z806 Family history of leukemia: Secondary | ICD-10-CM

## 2018-01-23 DIAGNOSIS — F418 Other specified anxiety disorders: Secondary | ICD-10-CM

## 2018-01-23 DIAGNOSIS — Z803 Family history of malignant neoplasm of breast: Secondary | ICD-10-CM

## 2018-01-23 DIAGNOSIS — Z5111 Encounter for antineoplastic chemotherapy: Secondary | ICD-10-CM | POA: Diagnosis not present

## 2018-01-23 DIAGNOSIS — R110 Nausea: Secondary | ICD-10-CM | POA: Diagnosis not present

## 2018-01-23 DIAGNOSIS — Z800 Family history of malignant neoplasm of digestive organs: Secondary | ICD-10-CM | POA: Diagnosis not present

## 2018-01-23 DIAGNOSIS — Z8042 Family history of malignant neoplasm of prostate: Secondary | ICD-10-CM

## 2018-01-23 DIAGNOSIS — R451 Restlessness and agitation: Secondary | ICD-10-CM

## 2018-01-23 LAB — CBC WITH DIFFERENTIAL (CANCER CENTER ONLY)
BASOS ABS: 0 10*3/uL (ref 0.0–0.1)
Basophils Relative: 1 %
EOS ABS: 0.1 10*3/uL (ref 0.0–0.5)
EOS PCT: 2 %
HCT: 39 % (ref 38.4–49.9)
HEMOGLOBIN: 13.4 g/dL (ref 13.0–17.1)
LYMPHS PCT: 30 %
Lymphs Abs: 1.2 10*3/uL (ref 0.9–3.3)
MCH: 31 pg (ref 27.2–33.4)
MCHC: 34.4 g/dL (ref 32.0–36.0)
MCV: 90.3 fL (ref 79.3–98.0)
Monocytes Absolute: 0 10*3/uL — ABNORMAL LOW (ref 0.1–0.9)
Monocytes Relative: 0 %
NEUTROS PCT: 67 %
Neutro Abs: 2.6 10*3/uL (ref 1.5–6.5)
PLATELETS: 194 10*3/uL (ref 140–400)
RBC: 4.32 MIL/uL (ref 4.20–5.82)
RDW: 11.5 % (ref 11.0–14.6)
WBC: 3.9 10*3/uL — AB (ref 4.0–10.3)

## 2018-01-23 LAB — CMP (CANCER CENTER ONLY)
ALT: 44 U/L (ref 0–55)
ANION GAP: 10 (ref 3–11)
AST: 22 U/L (ref 5–34)
Albumin: 4.4 g/dL (ref 3.5–5.0)
Alkaline Phosphatase: 61 U/L (ref 40–150)
BILIRUBIN TOTAL: 0.9 mg/dL (ref 0.2–1.2)
BUN: 17 mg/dL (ref 7–26)
CO2: 26 mmol/L (ref 22–29)
Calcium: 9.6 mg/dL (ref 8.4–10.4)
Chloride: 99 mmol/L (ref 98–109)
Creatinine: 0.81 mg/dL (ref 0.70–1.30)
GFR, Est AFR Am: >60 mL/min (ref >60)
GFR, Estimated: >60 mL/min (ref >60)
Glucose, Bld: 96 mg/dL (ref 70–140)
POTASSIUM: 4.2 mmol/L (ref 3.5–5.1)
Sodium: 135 mmol/L — ABNORMAL LOW (ref 136–145)
TOTAL PROTEIN: 7.8 g/dL (ref 6.4–8.3)

## 2018-01-23 MED ORDER — PROCHLORPERAZINE MALEATE 10 MG PO TABS
10.0000 mg | ORAL_TABLET | Freq: Once | ORAL | Status: AC
  Administered 2018-01-23: 10 mg via ORAL

## 2018-01-23 MED ORDER — SODIUM CHLORIDE 0.9 % IV SOLN
Freq: Once | INTRAVENOUS | Status: AC
  Administered 2018-01-23: 11:00:00 via INTRAVENOUS

## 2018-01-23 MED ORDER — PROCHLORPERAZINE MALEATE 10 MG PO TABS
ORAL_TABLET | ORAL | Status: AC
  Filled 2018-01-23: qty 1

## 2018-01-23 MED ORDER — SODIUM CHLORIDE 0.9 % IV SOLN
30.0000 [IU] | Freq: Once | INTRAVENOUS | Status: AC
  Administered 2018-01-23: 30 [IU] via INTRAVENOUS
  Filled 2018-01-23: qty 10

## 2018-01-23 NOTE — Patient Instructions (Signed)
[facility] Discharge Instructions for Patients Receiving Chemotherapy  Today you received the following chemotherapy agents: bleomycin,   To help prevent nausea and vomiting after your treatment, we encourage you to take your nausea medication as prescribed.   If you develop nausea and vomiting that is not controlled by your nausea medication, call the clinic.   BELOW ARE SYMPTOMS THAT SHOULD BE REPORTED IMMEDIATELY:  *FEVER GREATER THAN 100.5 F  *CHILLS WITH OR WITHOUT FEVER  NAUSEA AND VOMITING THAT IS NOT CONTROLLED WITH YOUR NAUSEA MEDICATION  *UNUSUAL SHORTNESS OF BREATH  *UNUSUAL BRUISING OR BLEEDING  TENDERNESS IN MOUTH AND THROAT WITH OR WITHOUT PRESENCE OF ULCERS  *URINARY PROBLEMS  *BOWEL PROBLEMS  UNUSUAL RASH Items with * indicate a potential emergency and should be followed up as soon as possible.  Feel free to call the clinic should you have any questions or concerns. The clinic phone number is [phone].  Please show the [facility] at check-in to the Emergency Department and triage nurse.  Cisplatin injection What is this medicine? CISPLATIN (SIS pla tin) is a chemotherapy drug. It targets fast dividing cells, like cancer cells, and causes these cells to die. This medicine is used to treat many types of cancer like bladder, ovarian, and testicular cancers. This medicine may be used for other purposes; ask your health care provider or pharmacist if you have questions. COMMON BRAND NAME(S): Platinol, Platinol -AQ What should I tell my health care provider before I take this medicine? They need to know if you have any of these conditions: -blood disorders -hearing problems -kidney disease -recent or ongoing radiation therapy -an unusual or allergic reaction to cisplatin, carboplatin, other chemotherapy, other medicines, foods, dyes, or preservatives -pregnant or trying to get pregnant -breast-feeding How should I use this  medicine? This drug is given as an infusion into a vein. It is administered in a hospital or clinic by a specially trained health care professional. Talk to your pediatrician regarding the use of this medicine in children. Special care may be needed. Overdosage: If you think you have taken too much of this medicine contact a poison control center or emergency room at once. NOTE: This medicine is only for you. Do not share this medicine with others. What if I miss a dose? It is important not to miss a dose. Call your doctor or health care professional if you are unable to keep an appointment. What may interact with this medicine? -dofetilide -foscarnet -medicines for seizures -medicines to increase blood counts like filgrastim, pegfilgrastim, sargramostim -probenecid -pyridoxine used with altretamine -rituximab -some antibiotics like amikacin, gentamicin, neomycin, polymyxin B, streptomycin, tobramycin -sulfinpyrazone -vaccines -zalcitabine Talk to your doctor or health care professional before taking any of these medicines: -acetaminophen -aspirin -ibuprofen -ketoprofen -naproxen This list may not describe all possible interactions. Give your health care provider a list of all the medicines, herbs, non-prescription drugs, or dietary supplements you use. Also tell them if you smoke, drink alcohol, or use illegal drugs. Some items may interact with your medicine. What should I watch for while using this medicine? Your condition will be monitored carefully while you are receiving this medicine. You will need important blood work done while you are taking this medicine. This drug may make you feel generally unwell. This is not uncommon, as chemotherapy can affect healthy cells as well as cancer cells. Report any side effects. Continue your course of treatment even though you feel ill unless your doctor tells you to  stop. In some cases, you may be given additional medicines to help with side  effects. Follow all directions for their use. Call your doctor or health care professional for advice if you get a fever, chills or sore throat, or other symptoms of a cold or flu. Do not treat yourself. This drug decreases your body's ability to fight infections. Try to avoid being around people who are sick. This medicine may increase your risk to bruise or bleed. Call your doctor or health care professional if you notice any unusual bleeding. Be careful brushing and flossing your teeth or using a toothpick because you may get an infection or bleed more easily. If you have any dental work done, tell your dentist you are receiving this medicine. Avoid taking products that contain aspirin, acetaminophen, ibuprofen, naproxen, or ketoprofen unless instructed by your doctor. These medicines may hide a fever. Do not become pregnant while taking this medicine. Women should inform their doctor if they wish to become pregnant or think they might be pregnant. There is a potential for serious side effects to an unborn child. Talk to your health care professional or pharmacist for more information. Do not breast-feed an infant while taking this medicine. Drink fluids as directed while you are taking this medicine. This will help protect your kidneys. Call your doctor or health care professional if you get diarrhea. Do not treat yourself. What side effects may I notice from receiving this medicine? Side effects that you should report to your doctor or health care professional as soon as possible: -allergic reactions like skin rash, itching or hives, swelling of the face, lips, or tongue -signs of infection - fever or chills, cough, sore throat, pain or difficulty passing urine -signs of decreased platelets or bleeding - bruising, pinpoint red spots on the skin, black, tarry stools, nosebleeds -signs of decreased red blood cells - unusually weak or tired, fainting spells, lightheadedness -breathing  problems -changes in hearing -gout pain -low blood counts - This drug may decrease the number of white blood cells, red blood cells and platelets. You may be at increased risk for infections and bleeding. -nausea and vomiting -pain, swelling, redness or irritation at the injection site -pain, tingling, numbness in the hands or feet -problems with balance, movement -trouble passing urine or change in the amount of urine Side effects that usually do not require medical attention (report to your doctor or health care professional if they continue or are bothersome): -changes in vision -loss of appetite -metallic taste in the mouth or changes in taste This list may not describe all possible side effects. Call your doctor for medical advice about side effects. You may report side effects to FDA at 1-800-FDA-1088. Where should I keep my medicine? This drug is given in a hospital or clinic and will not be stored at home. NOTE: This sheet is a summary. It may not cover all possible information. If you have questions about this medicine, talk to your doctor, pharmacist, or health care provider.  2018 Elsevier/Gold Standard (2007-11-27 14:40:54)

## 2018-01-23 NOTE — Progress Notes (Signed)
Hematology and Oncology Follow Up Visit  Ricky Williams [id number] Dec 08, 1995 22 y.o. 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   Current therapy: Adjuvant chemotherapy utilizing 1 cycle of BEP.  Today is day 9 of therapy.  Interim History: Sol presents today for a follow-up visit.  Since last visit, he received day 1 through 5 of cycle 1 of chemotherapy with a few complications.  He had mild nausea but no vomiting as well as restlessness associated with dexamethasone.  He denies any vomiting, peripheral neuropathy or infusion related complications.  He denies any shortness of breath or wheezing.  He denies any dyspnea on exertion.  He continues to eat reasonably well and maintain his weight.  He does not report any headaches, blurry vision, syncope or seizures. Does not report any fevers, chills or sweats.  Does not report any cough, wheezing or hemoptysis.  Does not report any chest pain, palpitation, orthopnea or leg edema.  Does not report any nausea, vomiting or abdominal pain.  Does not report any constipation or diarrhea.  Does not report any skeletal complaints.    Does not report frequency, urgency or hematuria.  Does not report any skin rashes or lesions. Does not report any heat or cold intolerance.  Does not report any lymphadenopathy or petechiae.  Does not report any anxiety or depression.  Remaining review of systems is negative.    Medications: I have reviewed the patient's current medications.  Current Outpatient Medications  Medication Sig Dispense Refill  . acetaminophen (TYLENOL) 325 MG tablet Take 650 mg by mouth  every 6 (six) hours as needed.    Marland Kitchen buPROPion (WELLBUTRIN SR) 100 MG 12 hr tablet Take 100 mg by mouth 2 (two) times daily.   2  . diphenhydrAMINE (BENADRYL) 25 MG tablet Take 25 mg by mouth every 6 (six) hours as needed.    . doxycycline (VIBRA-TABS) 100 MG tablet Take 1 tablet (100 mg total) by mouth 2 (two) times daily. 20 tablet 0  . loratadine (CLARITIN) 10 MG tablet Take 10 mg by mouth daily as needed for allergies.    Marland Kitchen LORazepam (ATIVAN) 1 MG tablet Take 1 tablet (1 mg total) by mouth every 8 (eight) hours as needed for anxiety. 30 tablet 0  . lurasidone (LATUDA) 20 MG TABS tablet Take by mouth.    . mirtazapine (REMERON) 7.5 MG tablet Take 7.5 mg by mouth at bedtime.    . Multiple Vitamins-Minerals (MULTIVITAMIN WITH MINERALS) tablet Take 1 tablet by mouth daily.    Marland Kitchen OLANZapine (ZYPREXA) 5 MG tablet Take 1-2 tablets (5-10 mg total) by mouth at bedtime. For chemotherapy induced nausea. 40 tablet 1  . prochlorperazine (COMPAZINE) 10 MG tablet Take 1 tablet (10 mg total) by mouth once for 1 dose. 30 tablet 0  . Vilazodone HCl (VIIBRYD) 10 MG TABS Take 10 mg by mouth at bedtime.     No current facility-administered medications for this visit.      Allergies:  Allergies  Allergen Reactions  . Adhesive [Tape] Rash    Past Medical History, Surgical history, Social history, and Family History remain unchanged on review.  Physical Exam: Blood pressure 111/72, pulse 96, temperature 97.6 F (36.4 C), temperature source Oral, resp. rate 18, height 5\' 9"  (1.753 m), weight 123 lb 8 oz (56 kg), SpO2 100 %.   ECOG: 0 General appearance: alert and cooperative appeared without distress. Head: Normocephalic, without obvious abnormality Oropharynx: No oral thrush or ulcers. Eyes: No scleral icterus.  Pupils are equal and round reactive to light. Lymph nodes: Cervical, supraclavicular, and axillary nodes normal. Heart:regular rate and rhythm, S1, S2 normal, no murmur. Lung:chest clear,  no wheezing, rales, normal symmetric air entry Abdomin: soft, non-tender, without masses or organomegaly. Neurological: No motor, sensory deficits.  Intact deep tendon reflexes. Skin: No rashes or lesions.  No ecchymosis or petechiae. Musculoskeletal: No joint deformity or effusion. Psychiatric: Mood and affect are appropriate.    Lab Results: Lab Results  Component Value Date   WBC 3.7 (L) 01/15/2018   HGB 14.0 01/15/2018   HCT 41.1 01/15/2018   MCV 90.6 01/15/2018   PLT 246 01/15/2018     Chemistry      Component Value Date/Time   NA 139 01/15/2018 0853   K 3.9 01/15/2018 0853   CL 104 01/15/2018 0853   CO2 28 01/15/2018 0853   BUN 12 01/15/2018 0853   CREATININE 0.82 01/15/2018 0853      Component Value Date/Time   CALCIUM 9.6 01/15/2018 0853   ALKPHOS 66 01/15/2018 0853   AST 19 01/15/2018 0853   ALT 25 01/15/2018 0853   BILITOT 0.4 01/15/2018 0853     Results for JAECE, DUCHARME (MRN [id number]) as of 01/23/2018 10:11  Ref. Range 01/15/2018 08:53  AFP, Serum, Tumor Marker Latest Ref Range: 0.0 - 8.3 ng/mL 0.9  hCG Quant Latest Ref Range: 0 - 3 mIU/mL <1  Results for JUWAN, VENCES (MRN [id number]) as of 01/23/2018 10:11  Ref. Range 01/15/2018 08:53  LDH Latest Ref Range: 125 - 245 U/L 145    Impression and Plan:   22 year old man with:  1.  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  He is receiving adjuvant chemotherapy and received day 1 to 5 of cycle 1 of BEP chemotherapy.  He is scheduled for day 9 of bleomycin today and the plan to proceed without any dose reduction or delay.  He will receive his last dose of bleomycin on Jan 30, 2018.  Upon completing his chemotherapy he will undergo active surveillance.  This was outlined to the patient and his family today.  2.  Pulmonary consideration: His baseline pulmonary function test including DLCO was within normal  range.  3.    Restlessness: Related to dexamethasone which she will no longer leaving it at this time.  4.  IV access: Peripheral veins were used for the first cycle of chemotherapy.  5.  Nausea: Antiemetics is available to him at this time.  6.  Follow-up: 1 week for his last dose of bleomycin.  In few weeks after that to ensure no complications related to chemotherapy.  25  minutes was spent with the patient face-to-face today.  More than 50% of time was dedicated to patient counseling, education and answering questions regarding diagnosis, prognosis and different options of treatment.    Zola Button, MD 5/21/201910:07 AM

## 2018-01-30 ENCOUNTER — Inpatient Hospital Stay: Payer: 59

## 2018-01-30 VITALS — BP 103/66 | HR 79 | Temp 98.8°F | Resp 16 | Wt 124.5 lb

## 2018-01-30 DIAGNOSIS — C6292 Malignant neoplasm of left testis, unspecified whether descended or undescended: Secondary | ICD-10-CM

## 2018-01-30 LAB — CBC WITH DIFFERENTIAL (CANCER CENTER ONLY)
BASOS ABS: 0 10*3/uL (ref 0.0–0.1)
Basophils Relative: 3 %
EOS ABS: 0 10*3/uL (ref 0.0–0.5)
EOS PCT: 3 %
HCT: 37.2 % — ABNORMAL LOW (ref 38.4–49.9)
HEMOGLOBIN: 12.5 g/dL — AB (ref 13.0–17.1)
LYMPHS ABS: 0.9 10*3/uL (ref 0.9–3.3)
Lymphocytes Relative: 73 %
MCH: 30.8 pg (ref 27.2–33.4)
MCHC: 33.5 g/dL (ref 32.0–36.0)
MCV: 91.9 fL (ref 79.3–98.0)
Monocytes Absolute: 0.2 10*3/uL (ref 0.1–0.9)
Monocytes Relative: 16 %
NEUTROS PCT: 5 %
Neutro Abs: 0.1 10*3/uL — CL (ref 1.5–6.5)
PLATELETS: 198 10*3/uL (ref 140–400)
RBC: 4.04 MIL/uL — AB (ref 4.20–5.82)
RDW: 12.2 % (ref 11.0–14.6)
WBC: 1.2 10*3/uL — AB (ref 4.0–10.3)

## 2018-01-30 LAB — CMP (CANCER CENTER ONLY)
ALT: 27 U/L (ref 0–55)
AST: 18 U/L (ref 5–34)
Albumin: 4.3 g/dL (ref 3.5–5.0)
Alkaline Phosphatase: 61 U/L (ref 40–150)
Anion gap: 9 (ref 3–11)
BUN: 9 mg/dL (ref 7–26)
CHLORIDE: 105 mmol/L (ref 98–109)
CO2: 27 mmol/L (ref 22–29)
CREATININE: 0.78 mg/dL (ref 0.70–1.30)
Calcium: 9.2 mg/dL (ref 8.4–10.4)
Glucose, Bld: 89 mg/dL (ref 70–140)
POTASSIUM: 3.9 mmol/L (ref 3.5–5.1)
Sodium: 141 mmol/L (ref 136–145)
TOTAL PROTEIN: 7.4 g/dL (ref 6.4–8.3)

## 2018-01-30 MED ORDER — SODIUM CHLORIDE 0.9 % IV SOLN
Freq: Once | INTRAVENOUS | Status: AC
  Administered 2018-01-30: 16:00:00 via INTRAVENOUS

## 2018-01-30 MED ORDER — PROCHLORPERAZINE MALEATE 10 MG PO TABS
ORAL_TABLET | ORAL | Status: AC
  Filled 2018-01-30: qty 1

## 2018-01-30 MED ORDER — SODIUM CHLORIDE 0.9 % IV SOLN
30.0000 [IU] | Freq: Once | INTRAVENOUS | Status: AC
  Administered 2018-01-30: 30 [IU] via INTRAVENOUS
  Filled 2018-01-30: qty 10

## 2018-01-30 MED ORDER — PROCHLORPERAZINE MALEATE 10 MG PO TABS
10.0000 mg | ORAL_TABLET | Freq: Once | ORAL | Status: AC
  Administered 2018-01-30: 10 mg via ORAL

## 2018-01-30 NOTE — Progress Notes (Signed)
Per Dr. Shadad, it's OK to treat with today's labs. Infusion room nurse notified. 

## 2018-01-30 NOTE — Patient Instructions (Addendum)
[facility] Discharge Instructions for Patients Receiving Chemotherapy  Today you received the following chemotherapy agents: bleomycin,   To help prevent nausea and vomiting after your treatment, we encourage you to take your nausea medication as prescribed.   If you develop nausea and vomiting that is not controlled by your nausea medication, call the clinic.   BELOW ARE SYMPTOMS THAT SHOULD BE REPORTED IMMEDIATELY:  *FEVER GREATER THAN 100.5 F  *CHILLS WITH OR WITHOUT FEVER  NAUSEA AND VOMITING THAT IS NOT CONTROLLED WITH YOUR NAUSEA MEDICATION  *UNUSUAL SHORTNESS OF BREATH  *UNUSUAL BRUISING OR BLEEDING  TENDERNESS IN MOUTH AND THROAT WITH OR WITHOUT PRESENCE OF ULCERS  *URINARY PROBLEMS  *BOWEL PROBLEMS  UNUSUAL RASH Items with * indicate a potential emergency and should be followed up as soon as possible.  Feel free to call the clinic should you have any questions or concerns. The clinic phone number is [phone].  Please show the [facility] at check-in to the Emergency Department and triage nurse.    Neutropenia Neutropenia is a condition that occurs when you have a lower-than-normal level of a type of white blood cell (neutrophil) in your body. Neutrophils are made in the spongy center of large bones (bone marrow) and they fight infections. Neutrophils are your body's main defense against bacterial and fungal infections. The fewer neutrophils you have and the longer your body remains without them, the greater your risk of getting a severe infection. What are the causes? This condition can occur if your body uses up or destroys neutrophils faster than your bone marrow can make them. This problem may happen because of:  Bacterial or fungal infection.  Allergic disorders.  Reactions to some medicines.  Autoimmune disease.  An enlarged spleen.  This condition can also occur if your bone marrow does not produce enough neutrophils. This  problem may be caused by:  Cancer.  Cancer treatments, such as radiation or chemotherapy.  Viral infections.  Medicines, such as phenytoin.  Vitamin B12 deficiency.  Diseases of the bone marrow.  Environmental toxins, such as insecticides.  What are the signs or symptoms? This condition does not usually cause symptoms. If symptoms are present, they are usually caused by an underlying infection. Symptoms of an infection may include:  Fever.  Chills.  Swollen glands.  Oral or anal ulcers.  Cough and shortness of breath.  Rash.  Skin infection.  Fatigue.  How is this diagnosed? Your health care provider may suspect neutropenia if you have:  A condition that may cause neutropenia.  Symptoms of infection, especially fever.  Frequent and unusual infections.  You will have a medical history and physical exam. Tests will also be done, such as:  A complete blood count (CBC).  A procedure to collect a sample of bone marrow for examination (bone marrow biopsy).  A chest X-ray.  A urine culture.  A blood culture.  How is this treated? Treatment depends on the underlying cause and severity of your condition. Mild neutropenia may not require treatment. Treatment may include medicines, such as:  Antibiotic medicine given through an IV tube.  Antiviral medicines.  Antifungal medicines.  A medicine to increase neutrophil production (colony-stimulating factor). You may get this drug through an IV tube or by injection.  Steroids given through an IV tube.  If an underlying condition is causing neutropenia, you may need treatment for that condition. If medicines you are taking are causing neutropenia, your health care provider may have you stop  taking those medicines. Follow these instructions at home: Medicines  Take over-the-counter and prescription medicines only as told by your health care provider.  Get a seasonal flu shot (influenza  vaccine). Lifestyle  Do not eat unpasteurized foods.Do not eat unwashed raw fruits or vegetables.  Avoid exposure to groups of people or children.  Avoid being around people who are sick.  Avoid being around dirt or dust, such as in construction areas or gardens.  Do not provide direct care for pets. Avoid animal droppings. Do not clean litter boxes and bird cages. Hygiene   Bathe daily.  Clean the area between the genitals and the anus (perineal area) after you urinate or have a bowel movement. If you are male, wipe from front to back.  Brush your teeth with a soft toothbrush before and after meals.  Do not use a razor that has a blade. Use an electric razor to remove hair.  Wash your hands often. Make sure others who come in contact with you also wash their hands. If soap and water are not available, use hand sanitizer. General instructions  Do not have sex unless your health care provider has approved.  Take actions to avoid cuts and burns. For example: ? Be cautious when you use knives. Always cut away from yourself. ? Keep knives in protective sheaths or guards when not in use. ? Use oven mitts when you cook with a hot stove, oven, or grill. ? Stand a safe distance away from open fires.  Avoid people who received a vaccine in the past 30 days if that vaccine contained a live version of the germ (live vaccine). You should not get a live vaccine. Common live vaccines are varicella, measles, mumps, and rubella.  Do not share food utensils.  Do not use tampons, enemas, or rectal suppositories unless your health care provider has approved.  Keep all appointments as told by your health care provider. This is important. Contact a health care provider if:  You have a fever.  You have chills or you start to shake.  You have: ? A sore throat. ? A warm, red, or tender area on your skin. ? A cough. ? Frequent or painful urination. ? Vaginal discharge or itching.  You  develop: ? Sores in your mouth or anus. ? Swollen lymph nodes. ? Red streaks on the skin. ? A rash.  You feel: ? Nauseous or you vomit. ? Very fatigued. ? Short of breath. This information is not intended to replace advice given to you by your health care provider. Make sure you discuss any questions you have with your health care provider. Document Released: 02/11/2002 Document Revised: 01/28/2016 Document Reviewed: 03/04/2015 Elsevier Interactive Patient Education  [employer].

## 2018-02-02 ENCOUNTER — Telehealth: Payer: Self-pay | Admitting: Oncology

## 2018-02-02 NOTE — Telephone Encounter (Signed)
Faxed medical records to Dr. Lovena Neighbours at [phone] on 02/02/18, Release ID: [id number]

## 2018-02-07 ENCOUNTER — Ambulatory Visit: Payer: 59 | Admitting: Psychology

## 2018-02-08 ENCOUNTER — Telehealth: Payer: Self-pay

## 2018-02-08 ENCOUNTER — Inpatient Hospital Stay: Payer: Managed Care, Other (non HMO) | Attending: Oncology | Admitting: Oncology

## 2018-02-08 ENCOUNTER — Inpatient Hospital Stay: Payer: Managed Care, Other (non HMO)

## 2018-02-08 VITALS — BP 96/63 | HR 78 | Temp 97.7°F | Resp 17 | Wt 123.4 lb

## 2018-02-08 DIAGNOSIS — Z79899 Other long term (current) drug therapy: Secondary | ICD-10-CM | POA: Diagnosis not present

## 2018-02-08 DIAGNOSIS — Z9221 Personal history of antineoplastic chemotherapy: Secondary | ICD-10-CM | POA: Diagnosis not present

## 2018-02-08 DIAGNOSIS — C6292 Malignant neoplasm of left testis, unspecified whether descended or undescended: Secondary | ICD-10-CM | POA: Insufficient documentation

## 2018-02-08 LAB — CBC WITH DIFFERENTIAL (CANCER CENTER ONLY)
BASOS ABS: 0 10*3/uL (ref 0.0–0.1)
Basophils Relative: 1 %
EOS ABS: 0 10*3/uL (ref 0.0–0.5)
EOS PCT: 0 %
HCT: 41.2 % (ref 38.4–49.9)
HEMOGLOBIN: 13.9 g/dL (ref 13.0–17.1)
LYMPHS ABS: 1.5 10*3/uL (ref 0.9–3.3)
LYMPHS PCT: 47 %
MCH: 31.3 pg (ref 27.2–33.4)
MCHC: 33.7 g/dL (ref 32.0–36.0)
MCV: 92.8 fL (ref 79.3–98.0)
MONOS PCT: 20 %
Monocytes Absolute: 0.7 10*3/uL (ref 0.1–0.9)
NEUTROS PCT: 32 %
Neutro Abs: 1 10*3/uL — ABNORMAL LOW (ref 1.5–6.5)
Platelet Count: 292 10*3/uL (ref 140–400)
RBC: 4.44 MIL/uL (ref 4.20–5.82)
RDW: 12.6 % (ref 11.0–14.6)
WBC Count: 3.2 10*3/uL — ABNORMAL LOW (ref 4.0–10.3)

## 2018-02-08 LAB — LACTATE DEHYDROGENASE: LDH: 172 U/L (ref 125–245)

## 2018-02-08 LAB — CMP (CANCER CENTER ONLY)
ALK PHOS: 82 U/L (ref 40–150)
ALT: 20 U/L (ref 0–55)
ANION GAP: 9 (ref 3–11)
AST: 24 U/L (ref 5–34)
Albumin: 4.7 g/dL (ref 3.5–5.0)
BUN: 9 mg/dL (ref 7–26)
CALCIUM: 9.7 mg/dL (ref 8.4–10.4)
CO2: 28 mmol/L (ref 22–29)
CREATININE: 0.88 mg/dL (ref 0.70–1.30)
Chloride: 103 mmol/L (ref 98–109)
Glucose, Bld: 81 mg/dL (ref 70–140)
Potassium: 4 mmol/L (ref 3.5–5.1)
SODIUM: 140 mmol/L (ref 136–145)
TOTAL PROTEIN: 8.1 g/dL (ref 6.4–8.3)
Total Bilirubin: <0.2 mg/dL — ABNORMAL LOW (ref 0.2–1.2)

## 2018-02-08 NOTE — Progress Notes (Signed)
Hematology and Oncology Follow Up Visit  MOHIT ZIRBES [id number] 1996-05-15 22 y.o. 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   Current therapy: Adjuvant chemotherapy utilizing 1 cycle of BEP.  Cycle concluded on Jan 30, 2018.  Interim History: Williard is here for a follow-up visit.  Since last visit, he completed BEP chemotherapy cycle without any delayed complications.  He denies any delayed nausea, vomiting or peripheral neuropathy.  He denies any dyspnea on exertion or cough.  He has resumed all activities of daily living without any decline ability to do so.  His performance status is back to baseline.  He does not report any headaches, blurry vision, syncope or seizures.  He denies any alteration in mental status or psychiatric issues.  Does not report any fevers, chills or sweats.  Does not report any cough, wheezing or hemoptysis.  Does not report any chest pain, palpitation, orthopnea or leg edema.  Does not report any nausea, vomiting or abdominal pain.  Does not report any hematochezia or melena. Does not report any skeletal complaints.    Does not report frequency, urgency or hematuria.  Does not report any skin rashes or lesions.  Does not report any lymphadenopathy or petechiae.  Remaining review of systems is negative.    Medications: I have reviewed the patient's current medications.  Current Outpatient Medications  Medication Sig Dispense Refill  . acetaminophen (TYLENOL) 325 MG tablet Take 650 mg by mouth every 6 (six) hours as needed.    Marland Kitchen buPROPion (WELLBUTRIN SR) 100 MG 12 hr tablet Take 100 mg by mouth 2 (two) times daily.   2   . diphenhydrAMINE (BENADRYL) 25 MG tablet Take 25 mg by mouth every 6 (six) hours as needed.    . doxycycline (VIBRA-TABS) 100 MG tablet Take 1 tablet (100 mg total) by mouth 2 (two) times daily. 20 tablet 0  . loratadine (CLARITIN) 10 MG tablet Take 10 mg by mouth daily as needed for allergies.    Marland Kitchen LORazepam (ATIVAN) 1 MG tablet Take 1 tablet (1 mg total) by mouth every 8 (eight) hours as needed for anxiety. 30 tablet 0  . lurasidone (LATUDA) 20 MG TABS tablet Take by mouth.    . mirtazapine (REMERON) 7.5 MG tablet Take 7.5 mg by mouth at bedtime.    . Multiple Vitamins-Minerals (MULTIVITAMIN WITH MINERALS) tablet Take 1 tablet by mouth daily.    Marland Kitchen OLANZapine (ZYPREXA) 5 MG tablet Take 1-2 tablets (5-10 mg total) by mouth at bedtime. For chemotherapy induced nausea. 40 tablet 1  . prochlorperazine (COMPAZINE) 10 MG tablet Take 1 tablet (10 mg total) by mouth once for 1 dose. 30 tablet 0  . Vilazodone HCl (VIIBRYD) 10 MG TABS Take 10 mg by mouth at bedtime.     No current facility-administered medications for this visit.      Allergies:  Allergies  Allergen Reactions  . Adhesive [Tape] Rash    Past Medical History, Surgical history, Social history, and Family History remain unchanged on review.    Physical Exam: Blood pressure 96/63, pulse 78, temperature 97.7 F (36.5 C), temperature source Oral, resp. rate 17, weight 123 lb 6  oz (56 kg), SpO2 100 %.   ECOG: 0 General appearance: Well-appearing gentleman without distress. Head: Atraumatic without abnormalities. Oropharynx: Mucous membranes are moist and pink. Eyes: Sclerae clear without any icterus.  Extraocular muscle intact. Lymph nodes: No lymphadenopathy noted in the cervical, supraclavicular, and axillary nodes. Heart:regular rate and rhythm, without any murmurs or gallops. Lung: Clear in all lung fields without any rhonchi, wheezes or dullness to percussion Abdomin: soft, nontender without any rebound or  guarding. Neurological: No deficits noted motor, sensory deep tendon reflexes. Skin: Ecchymosis or petechiae. Musculoskeletal: No clubbing or cyanosis.     Lab Results: Lab Results  Component Value Date   WBC 1.2 (L) 01/30/2018   HGB 12.5 (L) 01/30/2018   HCT 37.2 (L) 01/30/2018   MCV 91.9 01/30/2018   PLT 198 01/30/2018     Chemistry      Component Value Date/Time   NA 141 01/30/2018 1415   K 3.9 01/30/2018 1415   CL 105 01/30/2018 1415   CO2 27 01/30/2018 1415   BUN 9 01/30/2018 1415   CREATININE 0.78 01/30/2018 1415      Component Value Date/Time   CALCIUM 9.2 01/30/2018 1415   ALKPHOS 61 01/30/2018 1415   AST 18 01/30/2018 1415   ALT 27 01/30/2018 1415   BILITOT <0.2 (L) 01/30/2018 1415        Impression and Plan:   22 year old man with:  1.    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  He completed 1 cycle of adjuvant BEP chemotherapy without any residual toxicities.  The next step of management of this disease was reviewed today with the patient and his family.  At this point, he will undergo active surveillance including repeat imaging studies in the immediate future and every 6 months for the first 2 years.  He will have laboratory testing done every 3 months in the interim.  2.  Pulmonary consideration: None related complications related to bleomycin.  His baseline DLCO was normal.  3.    Anxiety: No issues reported at this time.  4.  IV access: No issues with peripheral veins at this time.  He did not require a Port-A-Cath insertion.  5.  Neutropenia: Related to chemotherapy and has resolved at this time.  6.  Follow-up: We will be in 3 months to follow on progress.  15  minutes was spent with the patient face-to-face today.  More than 50% of time was dedicated to patient counseling, education and coordinating his care.   Zola Button, MD 6/6/201912:33 PM

## 2018-02-08 NOTE — Telephone Encounter (Signed)
Printed avs and calender iof upcoming appointment. Per 6/6 los

## 2018-02-09 LAB — AFP TUMOR MARKER: AFP, Serum, Tumor Marker: 1 ng/mL (ref 0.0–8.3)

## 2018-02-09 LAB — BETA HCG QUANT (REF LAB): hCG Quant: <1 m[IU]/mL (ref 0–3)

## 2018-02-19 ENCOUNTER — Telehealth: Payer: Self-pay | Admitting: *Deleted

## 2018-02-19 NOTE — Telephone Encounter (Signed)
Received voice mail message from mom stating,"Ricky Williams had lab work on 02/08/18 and I would like to know the results. Also, he is having a CT scan on Thursday of abdomen/pelvis. Dr. Alen Blew had ordered it with chest but insurance denied it. I want to know why they denied it. Should he have a chest xray if not a CT of chest? Return number is [phone].

## 2018-02-20 ENCOUNTER — Other Ambulatory Visit: Payer: Self-pay | Admitting: Oncology

## 2018-02-20 DIAGNOSIS — C6292 Malignant neoplasm of left testis, unspecified whether descended or undescended: Secondary | ICD-10-CM

## 2018-02-21 ENCOUNTER — Ambulatory Visit (INDEPENDENT_AMBULATORY_CARE_PROVIDER_SITE_OTHER): Payer: 59 | Admitting: Psychology

## 2018-02-21 DIAGNOSIS — F331 Major depressive disorder, recurrent, moderate: Secondary | ICD-10-CM

## 2018-02-22 ENCOUNTER — Encounter (HOSPITAL_COMMUNITY): Payer: Self-pay

## 2018-02-22 ENCOUNTER — Ambulatory Visit (HOSPITAL_COMMUNITY)
Admission: RE | Admit: 2018-02-22 | Discharge: 2018-02-22 | Disposition: A | Discharge status: Home / Self Care | Payer: Managed Care, Other (non HMO) | Source: Ambulatory Visit | Attending: Oncology | Admitting: Oncology

## 2018-02-22 DIAGNOSIS — C6292 Malignant neoplasm of left testis, unspecified whether descended or undescended: Secondary | ICD-10-CM | POA: Insufficient documentation

## 2018-02-22 MED ORDER — IOPAMIDOL (ISOVUE-300) INJECTION 61%
INTRAVENOUS | Status: AC
  Filled 2018-02-22: qty 100

## 2018-02-22 MED ORDER — IOPAMIDOL (ISOVUE-300) INJECTION 61%
100.0000 mL | Freq: Once | INTRAVENOUS | Status: AC | PRN
  Administered 2018-02-22: 100 mL via INTRAVENOUS

## 2018-02-28 ENCOUNTER — Telehealth: Payer: Self-pay | Admitting: *Deleted

## 2018-02-28 NOTE — Telephone Encounter (Signed)
Please let her know all scans are normal.

## 2018-02-28 NOTE — Telephone Encounter (Signed)
Received voice mail message from mom stating,"Daisy had scans on 02/22/18 and we still don't have any results. His next appointment is not until September. Return number is [phone].

## 2018-02-28 NOTE — Telephone Encounter (Signed)
As noted below by Dr. Alen Blew, I informed the mother that all scans are normal. She verbalized understanding.

## 2018-03-07 ENCOUNTER — Ambulatory Visit: Payer: 59 | Admitting: Psychology

## 2018-03-21 ENCOUNTER — Ambulatory Visit (INDEPENDENT_AMBULATORY_CARE_PROVIDER_SITE_OTHER): Payer: 59 | Admitting: Psychology

## 2018-03-21 DIAGNOSIS — F331 Major depressive disorder, recurrent, moderate: Secondary | ICD-10-CM

## 2018-04-04 ENCOUNTER — Ambulatory Visit (INDEPENDENT_AMBULATORY_CARE_PROVIDER_SITE_OTHER): Payer: 59 | Admitting: Psychology

## 2018-04-04 DIAGNOSIS — F331 Major depressive disorder, recurrent, moderate: Secondary | ICD-10-CM

## 2018-04-16 ENCOUNTER — Ambulatory Visit (HOSPITAL_COMMUNITY)
Admission: RE | Admit: 2018-04-16 | Discharge: 2018-04-16 | Disposition: A | Discharge status: Home / Self Care | Payer: Managed Care, Other (non HMO) | Source: Ambulatory Visit | Attending: Family Medicine | Admitting: Family Medicine

## 2018-04-16 ENCOUNTER — Other Ambulatory Visit (HOSPITAL_COMMUNITY): Payer: Self-pay | Admitting: Family Medicine

## 2018-04-16 DIAGNOSIS — R1030 Lower abdominal pain, unspecified: Secondary | ICD-10-CM

## 2018-04-16 DIAGNOSIS — R1031 Right lower quadrant pain: Secondary | ICD-10-CM

## 2018-04-16 DIAGNOSIS — K460 Unspecified abdominal hernia with obstruction, without gangrene: Secondary | ICD-10-CM | POA: Diagnosis not present

## 2018-04-16 MED ORDER — IOHEXOL 300 MG/ML  SOLN
100.0000 mL | Freq: Once | INTRAMUSCULAR | Status: AC | PRN
  Administered 2018-04-16: 100 mL via INTRAVENOUS

## 2018-04-17 ENCOUNTER — Telehealth: Payer: Self-pay | Admitting: Oncology

## 2018-04-17 ENCOUNTER — Inpatient Hospital Stay: Payer: Managed Care, Other (non HMO) | Attending: Oncology | Admitting: Oncology

## 2018-04-17 ENCOUNTER — Telehealth: Payer: Self-pay | Admitting: *Deleted

## 2018-04-17 ENCOUNTER — Inpatient Hospital Stay: Payer: Managed Care, Other (non HMO)

## 2018-04-17 VITALS — BP 99/66 | HR 85 | Temp 97.9°F | Resp 18 | Ht 69.0 in | Wt 131.4 lb

## 2018-04-17 DIAGNOSIS — Z79899 Other long term (current) drug therapy: Secondary | ICD-10-CM | POA: Diagnosis not present

## 2018-04-17 DIAGNOSIS — R772 Abnormality of alphafetoprotein: Secondary | ICD-10-CM | POA: Insufficient documentation

## 2018-04-17 DIAGNOSIS — Z9221 Personal history of antineoplastic chemotherapy: Secondary | ICD-10-CM | POA: Insufficient documentation

## 2018-04-17 DIAGNOSIS — C6292 Malignant neoplasm of left testis, unspecified whether descended or undescended: Secondary | ICD-10-CM

## 2018-04-17 LAB — CBC WITH DIFFERENTIAL (CANCER CENTER ONLY)
BASOS ABS: 0.1 10*3/uL (ref 0.0–0.1)
Basophils Relative: 3 %
Eosinophils Absolute: 0.1 10*3/uL (ref 0.0–0.5)
Eosinophils Relative: 4 %
HEMATOCRIT: 42.1 % (ref 38.4–49.9)
HEMOGLOBIN: 14.2 g/dL (ref 13.0–17.1)
LYMPHS PCT: 35 %
Lymphs Abs: 1.2 10*3/uL (ref 0.9–3.3)
MCH: 31.2 pg (ref 27.2–33.4)
MCHC: 33.7 g/dL (ref 32.0–36.0)
MCV: 92.4 fL (ref 79.3–98.0)
Monocytes Absolute: 0.3 10*3/uL (ref 0.1–0.9)
Monocytes Relative: 10 %
NEUTROS ABS: 1.7 10*3/uL (ref 1.5–6.5)
Neutrophils Relative %: 48 %
Platelet Count: 245 10*3/uL (ref 140–400)
RBC: 4.56 MIL/uL (ref 4.20–5.82)
RDW: 12.3 % (ref 11.0–14.6)
WBC: 3.4 10*3/uL — AB (ref 4.0–10.3)

## 2018-04-17 LAB — CMP (CANCER CENTER ONLY)
ALBUMIN: 4.8 g/dL (ref 3.5–5.0)
ALT: 34 U/L (ref 0–44)
ANION GAP: 11 (ref 5–15)
AST: 23 U/L (ref 15–41)
Alkaline Phosphatase: 73 U/L (ref 38–126)
BILIRUBIN TOTAL: 0.4 mg/dL (ref 0.3–1.2)
BUN: 10 mg/dL (ref 6–20)
CO2: 28 mmol/L (ref 22–32)
Calcium: 9.7 mg/dL (ref 8.9–10.3)
Chloride: 103 mmol/L (ref 98–111)
Creatinine: 0.9 mg/dL (ref 0.61–1.24)
GFR, Est AFR Am: >60 mL/min (ref >60)
GLUCOSE: 82 mg/dL (ref 70–99)
POTASSIUM: 4.4 mmol/L (ref 3.5–5.1)
Sodium: 142 mmol/L (ref 135–145)
TOTAL PROTEIN: 8.1 g/dL (ref 6.5–8.1)

## 2018-04-17 LAB — LACTATE DEHYDROGENASE: LDH: 176 U/L (ref 98–192)

## 2018-04-17 NOTE — Telephone Encounter (Signed)
Spoke with cheryl, NP coordinator @ [facility]. appt for lab and CT chest,abdomen and pelvis august 27th and appt with dr Dimas Millin august 28th. cherly will mail a new patient packet, with directions and times. Mother benita notified of appts. Dr Alen Blew aware.

## 2018-04-17 NOTE — Telephone Encounter (Signed)
Called regarding 8/13 at 12

## 2018-04-17 NOTE — Progress Notes (Signed)
Hematology and Oncology Follow Up Visit  Ricky Williams [id number] 07-Mar-1996 22 y.o. 04/17/2018 12:02 PM Ricky Williams, FNPMurray, Ricky Williams,*   Principle Diagnosis: 22 year old man with left testicular cancer diagnosed in February 2019.  He was found to have T2N0, I non-seminoma with 70% embryonal carcinoma.  He had an elevated alpha-fetoprotein 255.4 higher to orchiectomy.   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   Adjuvant chemotherapy utilizing 1 cycle of BEP.  Cycle concluded on Jan 30, 2018.  Follow-up CT scan in June 2019 showed no evidence of residual disease.  Current therapy: Active surveillance.  Interim History: [name] presents today for a follow-up visit today.  Since last visit, reports no delayed effect associated with chemotherapy.  He did not notice a left inguinal mass that is painless in the last few days.  He was evaluated by imaging studies.  Ultrasound obtained on May 07, 2018 showed a 2.9 x 2.5 x 2.2 cm left inguinal canal mass.  CT scan of the pelvis obtained 2019 showed a 3.1 x 3.0 left pelvic sidewall lymph node and 2.3 x 2.5 necrotic soft tissue lesion identified in the left inguinal canal.   He does not report any headaches, blurry vision, syncope or seizures.  He denies any dizziness or confusion.  Does not report any fevers, chills or sweats.  Does not report any cough, wheezing or hemoptysis.  Does not report any chest pain, palpitation, orthopnea or leg edema.  Does not report any nausea, vomiting or abdominal pain.  Does not report any hematochezia or melena.   Denies any bowel habit changes.   Does not report frequency, urgency or hematuria.  Does not report any skin rashes or lesions.  Does not report any lymphadenopathy or petechiae.  Denies any bleeding or clotting tendencies.  Remaining review of  systems is negative.    Medications: I have reviewed the patient's current medications.  Current Outpatient Medications  Medication Sig Dispense Refill  . acetaminophen (TYLENOL) 325 MG tablet Take 650 mg by mouth every 6 (six) hours as needed.    Marland Kitchen buPROPion (WELLBUTRIN SR) 100 MG 12 hr tablet Take 100 mg by mouth 2 (two) times daily.   2  . diphenhydrAMINE (BENADRYL) 25 MG tablet Take 25 mg by mouth every 6 (six) hours as needed.    . loratadine (CLARITIN) 10 MG tablet Take 10 mg by mouth daily as needed for allergies.    Marland Kitchen LORazepam (ATIVAN) 1 MG tablet Take 1 tablet (1 mg total) by mouth every 8 (eight) hours as needed for anxiety. 30 tablet 0  . lurasidone (LATUDA) 20 MG TABS tablet Take by mouth.    . mirtazapine (REMERON) 7.5 MG tablet Take 7.5 mg by mouth at bedtime.    . Multiple Vitamins-Minerals (MULTIVITAMIN WITH MINERALS) tablet Take 1 tablet by mouth daily.    Marland Kitchen OLANZapine (ZYPREXA) 5 MG tablet Take 1-2 tablets (5-10 mg total) by mouth at bedtime. For chemotherapy induced nausea. 40 tablet 1  . prochlorperazine (COMPAZINE) 10 MG tablet Take 1 tablet (10 mg total) by mouth once for 1 dose. 30 tablet 0  . Vilazodone HCl (VIIBRYD) 10 MG TABS Take 10 mg by mouth at bedtime.     No current facility-administered medications for this visit.      Allergies:  Allergies  Allergen Reactions  . Adhesive [Tape]  Rash    Past Medical History, Surgical history, Social history, and Family History remain unchanged on review.    Physical Exam:  Blood pressure 99/66, pulse 85, temperature 97.9 F (36.6 C), temperature source Oral, resp. rate 18, height 5\' 9"  (1.753 m), weight 131 lb 6.4 oz (59.6 kg), SpO2 100 %.   ECOG: 0 General appearance: Alert, awake gentleman without distress. Head: Normocephalic without abnormalities. Oropharynx: No oral thrush or ulcers. Eyes: Pupils are equal and round reactive to light. Lymph nodes: No inguinal adenopathy palpated.  No cervical,  supraclavicular or axillary lymphadenopathy. Heart:regular rate and rhythm without any murmurs or gallops. Lung: Clear that any rhonchi, wheezes or dullness to percussion. Abdomin: soft, not any rebound or guarding.  No shifting dullness or ascites. GU exam: Palpable mass in the left inguinal canal. Neurological: No motor or sensory deficits. Skin: No skin rashes or lesions. Musculoskeletal: No joint deformity or effusion.     Lab Results: Lab Results  Component Value Date   WBC 3.2 (L) 02/08/2018   HGB 13.9 02/08/2018   HCT 41.2 02/08/2018   MCV 92.8 02/08/2018   PLT 292 02/08/2018     Chemistry      Component Value Date/Time   NA 140 02/08/2018 1207   K 4.0 02/08/2018 1207   CL 103 02/08/2018 1207   CO2 28 02/08/2018 1207   BUN 9 02/08/2018 1207   CREATININE 0.88 02/08/2018 1207      Component Value Date/Time   CALCIUM 9.7 02/08/2018 1207   ALKPHOS 82 02/08/2018 1207   AST 24 02/08/2018 1207   ALT 20 02/08/2018 1207   BILITOT <0.2 (L) 02/08/2018 1207     Results for Ricky Williams, Ricky Williams (MRN [id number]) as of 04/17/2018 11:45  Ref. Range 01/15/2018 08:53 02/08/2018 12:07  AFP, Serum, Tumor Marker Latest Ref Range: 0.0 - 8.3 ng/mL 0.9 1.0  hCG Quant Latest Ref Range: 0 - 3 mIU/mL <1 <1    EXAM: CT PELVIS WITH CONTRAST  TECHNIQUE: Multidetector CT imaging of the pelvis was performed using the standard protocol following the bolus administration of intravenous contrast.  CONTRAST:  138mL OMNIPAQUE IOHEXOL 300 MG/ML  SOLN  COMPARISON:  02/22/2018  FINDINGS: Urinary Tract:  Bladder is distended.  Bowel:  No evidence for bowel obstruction.  Vascular/Lymphatic: 3.1 x 3.0 cm left pelvic sidewall lymph node (3:28). 2.3 x 2.5 cm necrotic soft tissue lesion identified in the region of the left inguinal canal (3:45).  Reproductive:  Status post left orchiectomy.  Other:  No intraperitoneal free fluid.  Musculoskeletal: No worrisome lytic or sclerotic  osseous abnormality.  IMPRESSION: Necrotic soft tissue nodule in the region of the left inguinal canal is associated with a bulky necrotic lymph node in the left pelvic sidewall. Imaging features highly concerning for metastatic disease.   Impression and Plan:   22 year old man with:  1.    Left testicular cancer diagnosed in February 2019.  He was found to have T2N0, stage I nonseminomatous tumor with 70% embryonal and 30% yolk sac tumor.  After orchiectomy, he received 1 cycle of BEP without complications.  He had a CT scan in June 2019 as well as chest x-ray that showed no evidence of disease.  His tumor markers at the time all have normalized after an elevated alpha-fetoprotein at the time of diagnosis.  Now he presents with the local recurrence in the inguinal canal as well as left pelvic sidewall adenopathy.  These findings were reviewed today with the patient and  his parents extensively.  CT scan laboratory testing from the time of diagnosis up to this point were reviewed personally and imaging studies were discussed.  Based on these imaging studies, it is clearly has developed recurrent disease that requires further treatment.  Options of therapy would be to restart systemic chemotherapy with 3 more cycles of BEP chemotherapy.  The other option is to consider surgical removal of his inguinal tumor as well as pelvic lymphadenectomy.  Prior to proceeding with that he will require staging CT scan of the abdomen as well as the chest as well as repeat tumor markers.  I will also will refer him to Dr. Dimas Millin urology at [facility] for an urgent evaluation regarding to the possible role of surgery in his case.  I fear that his inguinal recurrence might not respond to systemic chemotherapy and surgical therapy may be needed regardless.  Discussion today, the family is in agreement to obtain a second opinion at [facility] and if chemotherapy is his best  option, we will certainly proceed with it in the immediate future.   2.  Pulmonary consideration: Repeat pulmonary function test will be needed prior to restarting bleomycin.   3.  IV access: Port-A-Cath will be needed prior to the start of chemotherapy.  4.  Follow-up: We will be in the next few weeks to follow his progress.  30  minutes was spent with the patient face-to-face today.  More than 50% of time was dedicated to cussing the natural course of his disease, reviewing imaging studies as well as discussing treatment options.Zola Button, MD 8/13/201912:02 PM

## 2018-04-17 NOTE — Telephone Encounter (Signed)
Scheduled appt per 8/13 los - gave patient AVS and calender per los . Central radiology to contact patient with ct scan,.

## 2018-04-17 NOTE — Progress Notes (Signed)
Spoke with cheryl at [facility], new patient coordinator. Dr Dimas Millin will review patient's chart in epic and call us with an appt.

## 2018-04-18 ENCOUNTER — Ambulatory Visit (INDEPENDENT_AMBULATORY_CARE_PROVIDER_SITE_OTHER): Payer: 59 | Admitting: Psychology

## 2018-04-18 ENCOUNTER — Telehealth: Payer: Self-pay | Admitting: *Deleted

## 2018-04-18 DIAGNOSIS — F331 Major depressive disorder, recurrent, moderate: Secondary | ICD-10-CM | POA: Diagnosis not present

## 2018-04-18 LAB — AFP TUMOR MARKER: AFP, Serum, Tumor Marker: 1.2 ng/mL (ref 0.0–8.3)

## 2018-04-18 LAB — BETA HCG QUANT (REF LAB)

## 2018-04-18 NOTE — Telephone Encounter (Signed)
Spoke with mother Ricky Williams, let her know all labs and tumor markers are normal.

## 2018-04-18 NOTE — Telephone Encounter (Signed)
-----   Message from Wyatt Portela, MD sent at 04/18/2018  7:53 AM EDT ----- Please let his mother know all his his labs and tumor markers are normal.

## 2018-04-24 ENCOUNTER — Ambulatory Visit (HOSPITAL_COMMUNITY): Payer: Managed Care, Other (non HMO)

## 2018-05-02 ENCOUNTER — Ambulatory Visit: Payer: 59 | Admitting: Psychology

## 2018-05-10 ENCOUNTER — Other Ambulatory Visit: Payer: Self-pay

## 2018-05-10 ENCOUNTER — Ambulatory Visit: Payer: Self-pay | Admitting: Oncology

## 2018-05-16 ENCOUNTER — Ambulatory Visit: Payer: 59 | Admitting: Psychology

## 2018-05-30 ENCOUNTER — Ambulatory Visit: Payer: 59 | Admitting: Psychology

## 2018-06-08 ENCOUNTER — Telehealth: Payer: Self-pay | Admitting: Psychiatry

## 2018-06-08 DIAGNOSIS — F332 Major depressive disorder, recurrent severe without psychotic features: Secondary | ICD-10-CM

## 2018-06-08 MED ORDER — LURASIDONE HCL 20 MG PO TABS: 20.0000 mg | ORAL_TABLET | Freq: Every day | ORAL | 0 refills | Status: DC

## 2018-06-08 NOTE — Telephone Encounter (Signed)
Mother phones for Latuda refill patient having recent surgery again changing pharmacy to [facility] in [place].

## 2018-06-11 DIAGNOSIS — F332 Major depressive disorder, recurrent severe without psychotic features: Secondary | ICD-10-CM

## 2018-06-11 DIAGNOSIS — F82 Specific developmental disorder of motor function: Secondary | ICD-10-CM

## 2018-06-11 DIAGNOSIS — F900 Attention-deficit hyperactivity disorder, predominantly inattentive type: Secondary | ICD-10-CM | POA: Insufficient documentation

## 2018-06-11 DIAGNOSIS — F411 Generalized anxiety disorder: Secondary | ICD-10-CM | POA: Insufficient documentation

## 2018-06-11 DIAGNOSIS — F4010 Social phobia, unspecified: Secondary | ICD-10-CM | POA: Insufficient documentation

## 2018-06-11 DIAGNOSIS — F800 Phonological disorder: Secondary | ICD-10-CM | POA: Insufficient documentation

## 2018-06-13 ENCOUNTER — Ambulatory Visit (INDEPENDENT_AMBULATORY_CARE_PROVIDER_SITE_OTHER): Payer: 59 | Admitting: Psychology

## 2018-06-13 DIAGNOSIS — F331 Major depressive disorder, recurrent, moderate: Secondary | ICD-10-CM

## 2018-06-20 ENCOUNTER — Encounter: Payer: Self-pay | Admitting: Psychiatry

## 2018-06-20 ENCOUNTER — Ambulatory Visit (INDEPENDENT_AMBULATORY_CARE_PROVIDER_SITE_OTHER): Payer: 59 | Admitting: Psychiatry

## 2018-06-20 VITALS — BP 98/64 | HR 76 | Ht 69.5 in | Wt 120.0 lb

## 2018-06-20 DIAGNOSIS — F900 Attention-deficit hyperactivity disorder, predominantly inattentive type: Secondary | ICD-10-CM

## 2018-06-20 DIAGNOSIS — F411 Generalized anxiety disorder: Secondary | ICD-10-CM | POA: Diagnosis not present

## 2018-06-20 DIAGNOSIS — F82 Specific developmental disorder of motor function: Secondary | ICD-10-CM

## 2018-06-20 DIAGNOSIS — F4010 Social phobia, unspecified: Secondary | ICD-10-CM | POA: Diagnosis not present

## 2018-06-20 DIAGNOSIS — F332 Major depressive disorder, recurrent severe without psychotic features: Secondary | ICD-10-CM

## 2018-06-20 DIAGNOSIS — F800 Phonological disorder: Secondary | ICD-10-CM

## 2018-06-20 DIAGNOSIS — F339 Major depressive disorder, recurrent, unspecified: Secondary | ICD-10-CM

## 2018-06-20 MED ORDER — LURASIDONE HCL 20 MG PO TABS: 20.0000 mg | ORAL_TABLET | Freq: Every evening | ORAL | 11 refills | Status: DC

## 2018-06-20 MED ORDER — MIRTAZAPINE 7.5 MG PO TABS: 7.5000 mg | ORAL_TABLET | Freq: Every day | ORAL | 11 refills | Status: DC

## 2018-06-20 MED ORDER — ALPRAZOLAM ER 0.5 MG PO TB24: 0.5000 mg | ORAL_TABLET | Freq: Every day | ORAL | 5 refills | Status: DC

## 2018-06-20 MED ORDER — BUPROPION HCL ER (SR) 100 MG PO TB12: 100.0000 mg | ORAL_TABLET | Freq: Two times a day (BID) | ORAL | 11 refills | Status: DC

## 2018-06-20 MED ORDER — VORTIOXETINE HBR 20 MG PO TABS: 20.0000 mg | ORAL_TABLET | Freq: Every evening | ORAL | 11 refills | Status: DC

## 2018-06-20 NOTE — Progress Notes (Signed)
[facility] Med Check  Patient ID: BRIGHT SPIELMANN,  MRN: [id number]  PCP: Marrian Salvage, [place]  Date of Evaluation: 06/20/2018 Time spent:20 minutes   HISTORY/CURRENT STATUS: HPI  Individual Medical History/ Review of Systems: Changes? :Yes .  Graysin is seen conjointly with mother face-to-face with consent without collateral for psychiatric interview and exam in evaluation and management of treatment resistant depression comorbid with social and generalized anxiety, ADHD, and learning variations and now a second cancer diagnosis.  Mother spends half of the session reviewing what Mattew has been through surgically, with chemotherapy, and with the family in receiving still new diagnoses with which to cope.  Mother feels that Locke has done better than any expected and states that she and father consider his mood as good as can be expected though still depressed and anxious.  He had testicular surgery in February then chemo in May after which they expected to find resolution but CT scans found tumor in the pelvis, groin, and retroperitoneal area.  Subsequent surgery also required a vein graft for tumor invading the vascular wall and now requires more chemotherapy for the new diagnosis from the [place].  His hair has regrown from initial chemotherapy lighter in color and more curly also having a beard.  He wishes he could be back at [facility] instead of going through what he faces now.  Oncology used olanzapine for nausea and lorazepam for nausea and over activation on the chemo steroids.  He is trying to regain weight.  He now does best with the alprazolam extended release for being less anxious but still awake.  Family declines to stop Wellbutrin as it helps mood and motivation to function even though weight loss and anxiety are possible on the Wellbutrin, but he is regaining and compensating  Allergies: Adhesive [tape] and Emend [aprepitant]  Current Medications:  Current Outpatient  Medications:  .  vortioxetine HBr (TRINTELLIX) 20 MG TABS tablet, Take 1 tablet (20 mg total) by mouth Nightly., Disp: 30 tablet, Rfl: 11 .  acetaminophen (TYLENOL) 325 MG tablet, Take 650 mg by mouth every 6 (six) hours as needed., Disp: , Rfl:  .  ALPRAZolam (XANAX XR) 0.5 MG 24 hr tablet, Take 1 tablet (0.5 mg total) by mouth daily., Disp: 30 tablet, Rfl: 5 .  buPROPion (WELLBUTRIN SR) 100 MG 12 hr tablet, Take 1 tablet (100 mg total) by mouth 2 (two) times daily., Disp: 60 tablet, Rfl: 11 .  diphenhydrAMINE (BENADRYL) 25 MG tablet, Take 25 mg by mouth every 6 (six) hours as needed., Disp: , Rfl:  .  loratadine (CLARITIN) 10 MG tablet, Take 10 mg by mouth daily as needed for allergies., Disp: , Rfl:  .  LORazepam (ATIVAN) 1 MG tablet, Take 1 tablet (1 mg total) by mouth every 8 (eight) hours as needed for anxiety., Disp: 30 tablet, Rfl: 0 .  lurasidone (LATUDA) 20 MG TABS tablet, Take 1 tablet (20 mg total) by mouth Nightly., Disp: 30 tablet, Rfl: 11 .  mirtazapine (REMERON) 7.5 MG tablet, Take 1 tablet (7.5 mg total) by mouth at bedtime., Disp: 30 tablet, Rfl: 11 .  Multiple Vitamins-Minerals (MULTIVITAMIN WITH MINERALS) tablet, Take 1 tablet by mouth daily., Disp: , Rfl:  .  OLANZapine (ZYPREXA) 5 MG tablet, Take 1-2 tablets (5-10 mg total) by mouth at bedtime. For chemotherapy induced nausea., Disp: 40 tablet, Rfl: 1 .  prochlorperazine (COMPAZINE) 10 MG tablet, Take 1 tablet (10 mg total) by mouth once for 1 dose., Disp: 30 tablet,  Rfl: 0 Medication Side Effects: Appetite Suppression, Fatigue and Sleep Problems  Family Medical/ Social History: Changes? Yes family having to reorganize and structure time and resources to trying to save Lebaron's life relative to the new cancer.  MENTAL HEALTH EXAM: Testicular and now primitive neuroectodermal tumor, underweight, dysautonomia and migraine otherwise 1 system negative Blood pressure 98/64, pulse 76, height 5' 9.5" (1.765 m), weight 120 lb (54.4  kg).Body mass index is 17.47 kg/m.  General Appearance: Casual, Fairly Groomed and Guarded  Eye Contact:  Minimal  Speech:  Blocked and Slow  Volume:  Decreased  Mood:  Anxious and Dysphoric  Affect:  Constricted, Depressed and Restricted  Thought Process:  Coherent and Linear  Orientation:  Full (Time, Place, and Person)  Thought Content: Obsessions and Rumination   Suicidal Thoughts:  No  Homicidal Thoughts:  No  Memory:  Remote  Judgement:  Fair  Insight:  Fair  Psychomotor Activity:  Decreased  Concentration:  Concentration: Fair and Attention Span: Fair  Recall:  [employer] of Knowledge: Fair  Language: Fair  Akathisia:  No  AIMS (if indicated): done = 0  Assets:  Neurosurgeon  ADL's:  Intact  Cognition: WNL  Prognosis:  Poor    DIAGNOSES:    ICD-10-CM   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 ADHD, predominantly inattentive type F90.0 vortioxetine HBr (TRINTELLIX) 20 MG TABS tablet    buPROPion (WELLBUTRIN SR) 100 MG 12 hr tablet  3. Generalized anxiety disorder F41.1 ALPRAZolam (XANAX XR) 0.5 MG 24 hr tablet    vortioxetine HBr (TRINTELLIX) 20 MG TABS tablet    mirtazapine (REMERON) 7.5 MG tablet    buPROPion (WELLBUTRIN SR) 100 MG 12 hr tablet  4. Social anxiety disorder F40.10 ALPRAZolam (XANAX XR) 0.5 MG 24 hr tablet    vortioxetine HBr (TRINTELLIX) 20 MG TABS tablet    mirtazapine (REMERON) 7.5 MG tablet  5. 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 Severe episode of recurrent major depressive disorder, without psychotic features ([place]) F33.2 lurasidone (LATUDA) 20 MG TABS tablet    RECOMMENDATIONS: Intervention attempts to maintain current emotional and behavioral ability to  collaborate with oncologist next and to differentiate family resources and function to hierarchy and priorities at hand.  Mother request a year supply of medications for depression in case they were unable to return here sooner for follow-up but she plans to follow-up in 4 months if possible.  We will continue Wellbutrin 100 mg SR twice daily, Trintellix 20 mg nightly, Latuda 20 mg nightly, and Remeron 7.5 mg nightly as a month supply and 11 [facility] depression [facility] [place].  He is additionally prescribed the alprazolam 0.5 mg ER daily as a month supply and 5 refills for social and generalized [facility] [place].  Session attempts to reinforce family and patient coping and consistency for all treatment needed.    Delight Hoh, MD

## 2018-06-27 ENCOUNTER — Ambulatory Visit (INDEPENDENT_AMBULATORY_CARE_PROVIDER_SITE_OTHER): Payer: 59 | Admitting: Psychology

## 2018-06-27 DIAGNOSIS — F331 Major depressive disorder, recurrent, moderate: Secondary | ICD-10-CM

## 2018-07-11 ENCOUNTER — Ambulatory Visit: Payer: 59 | Admitting: Psychology

## 2018-07-25 ENCOUNTER — Other Ambulatory Visit: Payer: Self-pay | Admitting: Psychiatry

## 2018-07-25 ENCOUNTER — Ambulatory Visit (INDEPENDENT_AMBULATORY_CARE_PROVIDER_SITE_OTHER): Payer: 59 | Admitting: Psychology

## 2018-07-25 DIAGNOSIS — F331 Major depressive disorder, recurrent, moderate: Secondary | ICD-10-CM

## 2018-07-25 DIAGNOSIS — F339 Major depressive disorder, recurrent, unspecified: Secondary | ICD-10-CM

## 2018-07-25 DIAGNOSIS — F4010 Social phobia, unspecified: Secondary | ICD-10-CM

## 2018-07-25 DIAGNOSIS — F900 Attention-deficit hyperactivity disorder, predominantly inattentive type: Secondary | ICD-10-CM

## 2018-07-25 DIAGNOSIS — F411 Generalized anxiety disorder: Secondary | ICD-10-CM

## 2018-08-08 ENCOUNTER — Ambulatory Visit: Payer: 59 | Admitting: Psychology

## 2018-08-22 ENCOUNTER — Ambulatory Visit (INDEPENDENT_AMBULATORY_CARE_PROVIDER_SITE_OTHER): Payer: 59 | Admitting: Psychology

## 2018-08-22 DIAGNOSIS — F331 Major depressive disorder, recurrent, moderate: Secondary | ICD-10-CM | POA: Diagnosis not present

## 2018-09-19 ENCOUNTER — Ambulatory Visit: Payer: 59 | Admitting: Psychology

## 2018-10-02 ENCOUNTER — Inpatient Hospital Stay (HOSPITAL_COMMUNITY): Payer: Managed Care, Other (non HMO)

## 2018-10-02 ENCOUNTER — Emergency Department (HOSPITAL_COMMUNITY): Payer: Managed Care, Other (non HMO)

## 2018-10-02 ENCOUNTER — Other Ambulatory Visit: Payer: Self-pay

## 2018-10-02 ENCOUNTER — Encounter (HOSPITAL_COMMUNITY): Payer: Self-pay | Admitting: Obstetrics and Gynecology

## 2018-10-02 ENCOUNTER — Inpatient Hospital Stay (HOSPITAL_COMMUNITY)
Admission: EM | Admit: 2018-10-02 | Discharge: 2018-10-06 | DRG: 871 | Disposition: A | Discharge status: Home / Self Care | Payer: Managed Care, Other (non HMO) | Attending: Internal Medicine | Admitting: Internal Medicine

## 2018-10-02 DIAGNOSIS — R911 Solitary pulmonary nodule: Secondary | ICD-10-CM | POA: Diagnosis present

## 2018-10-02 DIAGNOSIS — R52 Pain, unspecified: Secondary | ICD-10-CM

## 2018-10-02 DIAGNOSIS — Z807 Family history of other malignant neoplasms of lymphoid, hematopoietic and related tissues: Secondary | ICD-10-CM

## 2018-10-02 DIAGNOSIS — E876 Hypokalemia: Secondary | ICD-10-CM | POA: Diagnosis present

## 2018-10-02 DIAGNOSIS — T451X5A Adverse effect of antineoplastic and immunosuppressive drugs, initial encounter: Secondary | ICD-10-CM | POA: Diagnosis present

## 2018-10-02 DIAGNOSIS — Z8042 Family history of malignant neoplasm of prostate: Secondary | ICD-10-CM

## 2018-10-02 DIAGNOSIS — R7881 Bacteremia: Secondary | ICD-10-CM

## 2018-10-02 DIAGNOSIS — R402252 Coma scale, best verbal response, oriented, at arrival to emergency department: Secondary | ICD-10-CM | POA: Diagnosis present

## 2018-10-02 DIAGNOSIS — R6521 Severe sepsis with septic shock: Secondary | ICD-10-CM

## 2018-10-02 DIAGNOSIS — D6959 Other secondary thrombocytopenia: Secondary | ICD-10-CM | POA: Diagnosis present

## 2018-10-02 DIAGNOSIS — R509 Fever, unspecified: Secondary | ICD-10-CM | POA: Diagnosis present

## 2018-10-02 DIAGNOSIS — C6290 Malignant neoplasm of unspecified testis, unspecified whether descended or undescended: Secondary | ICD-10-CM | POA: Diagnosis not present

## 2018-10-02 DIAGNOSIS — Z7969 Long term (current) use of other immunomodulators and immunosuppressants: Secondary | ICD-10-CM

## 2018-10-02 DIAGNOSIS — Z808 Family history of malignant neoplasm of other organs or systems: Secondary | ICD-10-CM | POA: Diagnosis not present

## 2018-10-02 DIAGNOSIS — E872 Acidosis: Secondary | ICD-10-CM | POA: Diagnosis present

## 2018-10-02 DIAGNOSIS — A0811 Acute gastroenteropathy due to Norwalk agent: Secondary | ICD-10-CM | POA: Diagnosis present

## 2018-10-02 DIAGNOSIS — R402362 Coma scale, best motor response, obeys commands, at arrival to emergency department: Secondary | ICD-10-CM | POA: Diagnosis present

## 2018-10-02 DIAGNOSIS — D899 Disorder involving the immune mechanism, unspecified: Secondary | ICD-10-CM

## 2018-10-02 DIAGNOSIS — A419 Sepsis, unspecified organism: Principal | ICD-10-CM

## 2018-10-02 DIAGNOSIS — F411 Generalized anxiety disorder: Secondary | ICD-10-CM | POA: Diagnosis present

## 2018-10-02 DIAGNOSIS — R6520 Severe sepsis without septic shock: Secondary | ICD-10-CM

## 2018-10-02 DIAGNOSIS — Z9221 Personal history of antineoplastic chemotherapy: Secondary | ICD-10-CM

## 2018-10-02 DIAGNOSIS — K529 Noninfective gastroenteritis and colitis, unspecified: Secondary | ICD-10-CM | POA: Diagnosis not present

## 2018-10-02 DIAGNOSIS — N451 Epididymitis: Secondary | ICD-10-CM | POA: Clinically undetermined

## 2018-10-02 DIAGNOSIS — Z79899 Other long term (current) drug therapy: Secondary | ICD-10-CM

## 2018-10-02 DIAGNOSIS — F329 Major depressive disorder, single episode, unspecified: Secondary | ICD-10-CM | POA: Diagnosis present

## 2018-10-02 DIAGNOSIS — A084 Viral intestinal infection, unspecified: Secondary | ICD-10-CM | POA: Diagnosis not present

## 2018-10-02 DIAGNOSIS — D6481 Anemia due to antineoplastic chemotherapy: Secondary | ICD-10-CM | POA: Diagnosis present

## 2018-10-02 DIAGNOSIS — Z8547 Personal history of malignant neoplasm of testis: Secondary | ICD-10-CM | POA: Diagnosis not present

## 2018-10-02 DIAGNOSIS — D84821 Immunodeficiency due to drugs: Secondary | ICD-10-CM

## 2018-10-02 DIAGNOSIS — E878 Other disorders of electrolyte and fluid balance, not elsewhere classified: Secondary | ICD-10-CM | POA: Diagnosis present

## 2018-10-02 DIAGNOSIS — C6292 Malignant neoplasm of left testis, unspecified whether descended or undescended: Secondary | ICD-10-CM | POA: Diagnosis not present

## 2018-10-02 DIAGNOSIS — D849 Immunodeficiency, unspecified: Secondary | ICD-10-CM | POA: Diagnosis present

## 2018-10-02 DIAGNOSIS — R402142 Coma scale, eyes open, spontaneous, at arrival to emergency department: Secondary | ICD-10-CM | POA: Diagnosis present

## 2018-10-02 HISTORY — DX: Malignant (primary) neoplasm, unspecified: C80.1

## 2018-10-02 LAB — C DIFFICILE QUICK SCREEN W PCR REFLEX
C DIFFICLE (CDIFF) ANTIGEN: NEGATIVE
C Diff interpretation: NOT DETECTED
C Diff toxin: NEGATIVE

## 2018-10-02 LAB — COMPREHENSIVE METABOLIC PANEL
ALBUMIN: 4.2 g/dL (ref 3.5–5.0)
ALK PHOS: 75 U/L (ref 38–126)
ALT: 39 U/L (ref 0–44)
AST: 36 U/L (ref 15–41)
Anion gap: 12 (ref 5–15)
BUN: 22 mg/dL — ABNORMAL HIGH (ref 6–20)
CALCIUM: 8.1 mg/dL — AB (ref 8.9–10.3)
CHLORIDE: 99 mmol/L (ref 98–111)
CO2: 22 mmol/L (ref 22–32)
CREATININE: 0.96 mg/dL (ref 0.61–1.24)
GFR calc non Af Amer: >60 mL/min (ref >60)
Glucose, Bld: 124 mg/dL — ABNORMAL HIGH (ref 70–99)
Potassium: 3.7 mmol/L (ref 3.5–5.1)
SODIUM: 133 mmol/L — AB (ref 135–145)
Total Bilirubin: 0.7 mg/dL (ref 0.3–1.2)
Total Protein: 6.8 g/dL (ref 6.5–8.1)

## 2018-10-02 LAB — CBC WITH DIFFERENTIAL/PLATELET
Abs Immature Granulocytes: 3.5 10*3/uL — ABNORMAL HIGH (ref 0.00–0.07)
BASOS PCT: 0 %
Basophils Absolute: 0 10*3/uL (ref 0.0–0.1)
Eosinophils Absolute: 0 10*3/uL (ref 0.0–0.5)
Eosinophils Relative: 0 %
HCT: 33 % — ABNORMAL LOW (ref 39.0–52.0)
Hemoglobin: 10.2 g/dL — ABNORMAL LOW (ref 13.0–17.0)
IMMATURE GRANULOCYTES: 12 %
LYMPHS PCT: 0 %
Lymphs Abs: 0.1 10*3/uL — ABNORMAL LOW (ref 0.7–4.0)
MCH: 29.7 pg (ref 26.0–34.0)
MCHC: 30.9 g/dL (ref 30.0–36.0)
MCV: 96.2 fL (ref 80.0–100.0)
MONO ABS: 2.1 10*3/uL — AB (ref 0.1–1.0)
MONOS PCT: 7 %
NEUTROS PCT: 81 %
Neutro Abs: 23.9 10*3/uL — ABNORMAL HIGH (ref 1.7–7.7)
PLATELETS: 121 10*3/uL — AB (ref 150–400)
RBC: 3.43 MIL/uL — AB (ref 4.22–5.81)
RDW: 17.2 % — ABNORMAL HIGH (ref 11.5–15.5)
WBC MORPHOLOGY: INCREASED
WBC: 29.6 10*3/uL — ABNORMAL HIGH (ref 4.0–10.5)
nRBC: 0.7 % — ABNORMAL HIGH (ref 0.0–0.2)

## 2018-10-02 LAB — INFLUENZA PANEL BY PCR (TYPE A & B)
Influenza A By PCR: NEGATIVE
Influenza B By PCR: NEGATIVE

## 2018-10-02 LAB — LACTIC ACID, PLASMA
LACTIC ACID, VENOUS: 6.2 mmol/L — AB (ref 0.5–1.9)
Lactic Acid, Venous: 2.1 mmol/L (ref 0.5–1.9)
Lactic Acid, Venous: 0.8 mmol/L (ref 0.5–1.9)

## 2018-10-02 LAB — URINALYSIS, ROUTINE W REFLEX MICROSCOPIC
Bilirubin Urine: NEGATIVE
Glucose, UA: NEGATIVE mg/dL
Ketones, ur: NEGATIVE mg/dL
Leukocytes, UA: NEGATIVE
Nitrite: NEGATIVE
Protein, ur: NEGATIVE mg/dL
Specific Gravity, Urine: 1.005 (ref 1.005–1.030)
pH: 6 (ref 5.0–8.0)

## 2018-10-02 LAB — PROTIME-INR
INR: 1.11
PROTHROMBIN TIME: 14.2 s (ref 11.4–15.2)

## 2018-10-02 MED ORDER — SODIUM CHLORIDE 0.9% FLUSH
3.0000 mL | Freq: Once | INTRAVENOUS | Status: AC
  Administered 2018-10-02: 3 mL via INTRAVENOUS

## 2018-10-02 MED ORDER — PIPERACILLIN-TAZOBACTAM 3.375 G IVPB: 3.3750 g | Freq: Three times a day (TID) | INTRAVENOUS | Status: DC

## 2018-10-02 MED ORDER — ALBUMIN HUMAN 25 % IV SOLN
50.0000 g | Freq: Four times a day (QID) | INTRAVENOUS | Status: DC
  Administered 2018-10-02: 12 g via INTRAVENOUS
  Filled 2018-10-02 (×3): qty 200

## 2018-10-02 MED ORDER — IOPAMIDOL (ISOVUE-370) INJECTION 76%
100.0000 mL | Freq: Once | INTRAVENOUS | Status: AC | PRN
  Administered 2018-10-02: 100 mL via INTRAVENOUS

## 2018-10-02 MED ORDER — NOREPINEPHRINE BITARTRATE 1 MG/ML IV SOLN
0.0000 ug/min | INTRAVENOUS | Status: DC
  Administered 2018-10-02: 4 ug/min via INTRAVENOUS
  Filled 2018-10-02 (×2): qty 4

## 2018-10-02 MED ORDER — METRONIDAZOLE IN NACL 5-0.79 MG/ML-% IV SOLN
500.0000 mg | Freq: Three times a day (TID) | INTRAVENOUS | Status: DC
  Administered 2018-10-03 – 2018-10-04 (×5): 500 mg via INTRAVENOUS
  Filled 2018-10-02 (×5): qty 100

## 2018-10-02 MED ORDER — VANCOMYCIN HCL IN DEXTROSE 1-5 GM/200ML-% IV SOLN
1000.0000 mg | Freq: Once | INTRAVENOUS | Status: AC
  Administered 2018-10-02: 1000 mg via INTRAVENOUS
  Filled 2018-10-02: qty 200

## 2018-10-02 MED ORDER — ONDANSETRON HCL 4 MG/2ML IJ SOLN: 4.0000 mg | Freq: Four times a day (QID) | INTRAMUSCULAR | Status: DC | PRN

## 2018-10-02 MED ORDER — SODIUM CHLORIDE 0.9 % IV BOLUS (SEPSIS)
1000.0000 mL | Freq: Once | INTRAVENOUS | Status: AC
  Administered 2018-10-02: 1000 mL via INTRAVENOUS

## 2018-10-02 MED ORDER — SODIUM CHLORIDE 0.9 % IV BOLUS
1000.0000 mL | Freq: Once | INTRAVENOUS | Status: AC
  Administered 2018-10-02: 1000 mL via INTRAVENOUS

## 2018-10-02 MED ORDER — SODIUM CHLORIDE 0.9 % IV SOLN
INTRAVENOUS | Status: DC
  Administered 2018-10-02 – 2018-10-03 (×2): via INTRAVENOUS

## 2018-10-02 MED ORDER — SODIUM CHLORIDE 0.9 % IV SOLN
INTRAVENOUS | Status: DC | PRN
  Administered 2018-10-02: 1000 mL via INTRAVENOUS

## 2018-10-02 MED ORDER — SODIUM CHLORIDE 0.9 % IV SOLN
2.0000 g | Freq: Once | INTRAVENOUS | Status: AC
  Administered 2018-10-02: 2 g via INTRAVENOUS
  Filled 2018-10-02: qty 2

## 2018-10-02 MED ORDER — HYDROCORTISONE NA SUCCINATE PF 100 MG IJ SOLR
50.0000 mg | Freq: Four times a day (QID) | INTRAMUSCULAR | Status: DC
  Administered 2018-10-02 – 2018-10-03 (×3): 50 mg via INTRAVENOUS
  Filled 2018-10-02 (×3): qty 2

## 2018-10-02 MED ORDER — ACETAMINOPHEN 325 MG PO TABS
650.0000 mg | ORAL_TABLET | Freq: Four times a day (QID) | ORAL | Status: DC | PRN
  Administered 2018-10-04 – 2018-10-05 (×3): 650 mg via ORAL
  Filled 2018-10-02 (×3): qty 2

## 2018-10-02 MED ORDER — ACETAMINOPHEN 325 MG PO TABS
650.0000 mg | ORAL_TABLET | Freq: Once | ORAL | Status: AC
  Administered 2018-10-02: 650 mg via ORAL
  Filled 2018-10-02: qty 2

## 2018-10-02 MED ORDER — PIPERACILLIN-TAZOBACTAM 3.375 G IVPB 30 MIN: 3.3750 g | Freq: Once | INTRAVENOUS | Status: DC

## 2018-10-02 MED ORDER — ENOXAPARIN SODIUM 40 MG/0.4ML ~~LOC~~ SOLN
40.0000 mg | SUBCUTANEOUS | Status: DC
  Administered 2018-10-02 – 2018-10-05 (×4): 40 mg via SUBCUTANEOUS
  Filled 2018-10-02 (×5): qty 0.4

## 2018-10-02 MED ORDER — VANCOMYCIN HCL IN DEXTROSE 1-5 GM/200ML-% IV SOLN
1000.0000 mg | Freq: Two times a day (BID) | INTRAVENOUS | Status: DC
  Administered 2018-10-03 (×2): 1000 mg via INTRAVENOUS
  Filled 2018-10-02 (×2): qty 200

## 2018-10-02 MED ORDER — METRONIDAZOLE IN NACL 5-0.79 MG/ML-% IV SOLN
500.0000 mg | Freq: Three times a day (TID) | INTRAVENOUS | Status: DC
  Administered 2018-10-02: 500 mg via INTRAVENOUS
  Filled 2018-10-02: qty 100

## 2018-10-02 MED ORDER — CIPROFLOXACIN IN D5W 400 MG/200ML IV SOLN
400.0000 mg | Freq: Two times a day (BID) | INTRAVENOUS | Status: DC
  Administered 2018-10-02 – 2018-10-04 (×4): 400 mg via INTRAVENOUS
  Filled 2018-10-02 (×4): qty 200

## 2018-10-02 NOTE — Progress Notes (Signed)
Pharmacy Antibiotic Note  Ricky Williams is a 23 y.o. male with PMH testicular CA following recent chemo admitted on 10/02/2018 with FUO.  Pharmacy has been consulted for vancomycin and Zosyn dosing.  Plan:  Vancomycin 1000 mg IV q12 hr (est AUC 547 based on SCr 1)  Measure vancomycin AUC at steady state as indicated  Zosyn 3.375 g IV given once over 30 minutes, then every 8 hrs by 4-hr infusion  SCr daily while on vanc/zosyn   Height: 5\' 9"  (175.3 cm) Weight: 130 lb (59 kg) IBW/kg (Calculated) : 70.7  Temp (24hrs), Avg:101.6 F (38.7 C), Min:100 F (37.8 C), Max:103.1 F (39.5 C)  Recent Labs  Lab 10/02/18 1427 10/02/18 1450 10/02/18 1726  WBC 29.6*  --   --   CREATININE 0.96  --   --   LATICACIDVEN  --  2.1* 6.2*    Estimated Creatinine Clearance: 102.4 mL/min (by C-G formula based on SCr of 0.96 mg/dL).    Allergies  Allergen Reactions  . Adhesive [Tape] Rash  . Emend [Aprepitant]      Thank you for allowing pharmacy to be a part of this patient's care.  Reuel Boom, PharmD, BCPS [phone] 10/02/2018, 7:14 PM

## 2018-10-02 NOTE — Progress Notes (Signed)
Pharmacy Antibiotic Note  Ricky Williams is a 23 y.o. male with PMH testicular CA following recent chemo admitted on 10/02/2018 with FUO.  Pharmacy initially was consulted for vancomycin and Zosyn dosing.  1/28 f/u:  Zosyn d/c'ed.  Pharmacy consulted for Cipro dosing for intra-abdominal infection.  Flagyl per MD.  Vancomycin to continue  Plan:  Vancomycin 1000 mg IV q12 hr (est AUC 547 based on SCr 1)  Measure vancomycin AUC at steady state as indicated  D/C Zosyn  Cipro 400mg  IV q12h  Flagyl per MD  Height: 5\' 9"  (175.3 cm) Weight: 130 lb (59 kg) IBW/kg (Calculated) : 70.7  Temp (24hrs), Avg:101.6 F (38.7 C), Min:100 F (37.8 C), Max:103.1 F (39.5 C)  Recent Labs  Lab 10/02/18 1427 10/02/18 1450 10/02/18 1726  WBC 29.6*  --   --   CREATININE 0.96  --   --   LATICACIDVEN  --  2.1* 6.2*    Estimated Creatinine Clearance: 102.4 mL/min (by C-G formula based on SCr of 0.96 mg/dL).    Allergies  Allergen Reactions  . Adhesive [Tape] Rash  . Emend [Aprepitant]    Antimicrobials this admission:  1/28 Cefepime x 1 dose 1/28 Vanc >>   1/28 Cipro >> 1/28 Flagyl >>  Dose adjustments this admission:    Microbiology results:  1/28 BCx: sent 1/28 UCx: sent  1/28 CDiff: sent  1/28 GI Panel PCR: sent    Thank you for allowing pharmacy to be a part of this patient's care.  Leone Haven, PharmD 10/02/2018, 8:42 PM

## 2018-10-02 NOTE — Consult Note (Signed)
NAME:  ACEN CRAUN, MRN:  [id number], DOB:  26-Jun-1996, LOS: 0 ADMISSION DATE:  10/02/2018, CONSULTATION DATE:  1/28 REFERRING MD:  Dr. Nevada Crane TRH, CHIEF COMPLAINT:  Septic shock  Brief History   23 year old male with recent dx testicular cancer having just undergone cycle 4 of IE chemotherapy now admitted for septic shock on 1/28.  Unknown source.  History of present illness   23 year old male with past medical history as below, which is significant for recent diagnosis of testicular cancer.  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  He has been receiving VAC alternating with IE. He was just admitted to [facility] for cycle for of IE from 1/15-1/19. He reportedly tolerated this without incident.   Then on 1/28 he presented to [facility] ED with a one day history of fever, chills, muscle aches, nausea, and vomiting. He called his oncology team at [facility] who requested him to come to the nearest ED for treatment. Upon presentation to the emergency department, he was found to be febrile to 103.1, hypotensive with MAP 55. Laboratory evaluation significant for WBC 29.6, hgb 10.2, plt 121, Ca 8.1, and lactic acid 2.8. CXR and UA did not make source immediately identifiable. He was treated with broad spectrum antibiotics and given IVF for hypotension, with which he initially responded. However, he then became hypotensive again and lactic acid increased to 6. He was started on pressors and PCCM was asked to admit.   Past Medical History   has a past medical history of Cancer ([place]), Eczema, GAD (generalized anxiety disorder), Major depression, Mass of left testicle, S/P ECT (electroconvulsive therapy) (x5 treatments  last one 10-18-2017), and Wears contact lenses.  Significant Hospital Events   Admit 1/28 >  Consults:    Procedures:    Significant Diagnostic Tests:  CTA chest 1/28 > No evidence of a pulmonary  embolism. Subtle area ground-glass opacity in the anteromedial left upper lobe. This is most likely minor atelectasis. Infection is felt unlikely. 3. 6 mm nodule in the anteromedial right middle lobe. This is atelectasis or inflammatory.   Micro Data:  Blood 1/28 > Urine 1/28 >  Antimicrobials:  Cefepime 1/28 (ED) Ciproloxacin 1/28 > Flagyl 1/28 > Vancomycin 1/28 >  Interim history/subjective:    Objective   Blood pressure (!) 85/38, pulse (!) 122, temperature (S) (!) 103.1 F (39.5 C), temperature source Oral, resp. rate (!) 22, height _0  (1.753 m), weight 59 kg, SpO2 96 %.        Intake/Output Summary (Last 24 hours) at 10/02/2018 1926 Last data filed at 10/02/2018 1808 Gross per 24 hour  Intake 2200 ml  Output -  Net 2200 ml   Filed Weights   10/02/18 1400 10/02/18 1504  Weight: 59 kg 59 kg    Examination: General: young thin adult male HENT: [place]/AT, PERRL, no JVD. Marked hair loss r/t chemotherapy Lungs: Clear Cardiovascular: RRR, no MRG Abdomen: Soft, mild diffuse tenerness.  Extremities: No acute deformity or ROM limitation Neuro: Awake, alert oriented  Resolved Hospital Problem list     Assessment & Plan:   Septic shock: etiology unclear, but he is an immunocompromised host due to cancer, chemotherapy. Started on empiric antibiotic treatment in the ED. CXR, CTA, and UA unremarkable. Unable to rule out intraabdominal infection or viral process at this point. Also, he has indwelling port-a-cath, which could potentially represent the source.  - Continue empiric antibiotics  as above.  - Follow cultures, RVP - Continued IVF resuscitation - Norepinephrine for MAP goal 65 mmHg - GI pathogen panel, check C diff.  - Stress dose steroids. - DC albumin - PRN tylenol for fevers  Testicular cancer: currently undergoing VAC/IE at [facility].  - Supportive care.  - If worsens clinically should discuss with [facility] for transfer.   Nausea/Vomiting - PRN  zofran  Anemia Thrombocytopenia both likely secondary to chemotherapy, and now acute illness.  - Trend CBC  Anxiety Depression long complicated history of both - Holding home xanax, wellbutrin, latuda, remeron, zyprexa, Trintellix   Pulmonary nodule: RML identified on CTA 1/28 - Outpatient follow up  Best practice:  Diet: Clears Pain/Anxiety/Delirium protocol (if indicated): Na VAP protocol (if indicated): Na DVT prophylaxis: Enoxaparin GI prophylaxis: Na Glucose control:  Mobility: up with assist Code Status: FULL Family Communication: Patient and parents updated in ED Disposition: ICU, critically ill but stable.   Labs   CBC: Recent Labs  Lab 10/02/18 1427  WBC 29.6*  NEUTROABS 23.9*  HGB 10.2*  HCT 33.0*  MCV 96.2  PLT 121*    Basic Metabolic Panel: Recent Labs  Lab 10/02/18 1427  NA 133*  K 3.7  CL 99  CO2 22  GLUCOSE 124*  BUN 22*  CREATININE 0.96  CALCIUM 8.1*   GFR: Estimated Creatinine Clearance: 102.4 mL/min (by C-G formula based on SCr of 0.96 mg/dL). Recent Labs  Lab 10/02/18 1427 10/02/18 1450 10/02/18 1726  WBC 29.6*  --   --   LATICACIDVEN  --  2.1* 6.2*    Liver Function Tests: Recent Labs  Lab 10/02/18 1427  AST 36  ALT 39  ALKPHOS 75  BILITOT 0.7  PROT 6.8  ALBUMIN 4.2   No results for input(s): LIPASE, AMYLASE in the last 168 hours. No results for input(s): AMMONIA in the last 168 hours.  ABG No results found for: PHART, PCO2ART, PO2ART, HCO3, TCO2, ACIDBASEDEF, O2SAT   Coagulation Profile: Recent Labs  Lab 10/02/18 1427  INR 1.11    Cardiac Enzymes: No results for input(s): CKTOTAL, CKMB, CKMBINDEX, TROPONINI in the last 168 hours.  HbA1C: No results found for: HGBA1C  CBG: No results for input(s): GLUCAP in the last 168 hours.  Review of Systems:   Bolds are positive  Constitutional: weight loss, gain, night sweats, Fevers, chills, fatigue .  HEENT: headaches, Sore throat, sneezing, nasal congestion,  post nasal drip, Difficulty swallowing, Tooth/dental problems, visual complaints visual changes, ear ache CV:  chest pain, radiates:,Orthopnea, PND, swelling in lower extremities, dizziness, palpitations, syncope.  GI  heartburn, indigestion, abdominal pain, nausea, vomiting, diarrhea, change in bowel habits, loss of appetite, bloody stools.  Resp: cough, productive: , hemoptysis, dyspnea, chest pain, pleuritic.  Skin: rash or itching or icterus GU: dysuria, change in color of urine, urgency or frequency. flank pain, hematuria  MS: joint pain or swelling. decreased range of motion  Psych: change in mood or affect. depression or anxiety.  Neuro: difficulty with speech, weakness, numbness, ataxia    Past Medical History  He,  has a past medical history of Cancer ([place]), Eczema, GAD (generalized anxiety disorder), Major depression, Mass of left testicle, S/P ECT (electroconvulsive therapy) (x5 treatments  last one 10-18-2017), and Wears contact lenses.   Surgical History    Past Surgical History:  Procedure Laterality Date  . NO PAST SURGERIES    . ORCHIECTOMY Left 11/01/2017   Procedure: LEFT ORCHIECTOMY;  Surgeon: Ceasar Mons, MD;  Location: [facility];  Service: Urology;  Laterality: Left;     Social History   reports that he has never smoked. He has never used smokeless tobacco. He reports that he does not drink alcohol or use drugs.   Family History   His family history includes Arthritis in his father; Breast cancer in his mother and paternal grandmother; Healthy in his paternal grandfather; Kidney Stones in his father; Melanoma in his maternal grandfather; Multiple myeloma in his maternal grandmother; Prostate cancer in his father.   Allergies Allergies  Allergen Reactions  . Adhesive [Tape] Rash  . Emend [Aprepitant]      Home Medications  Prior to Admission medications   Medication Sig Start Date End Date Taking? Authorizing Provider   acetaminophen (TYLENOL) 325 MG tablet Take 650 mg by mouth every 6 (six) hours as needed for mild pain or fever.    Yes [provider]  ALPRAZolam (XANAX XR) 0.5 MG 24 hr tablet Take 1 tablet (0.5 mg total) by mouth daily. Patient taking differently: Take 0.5 mg by mouth daily as needed for anxiety.  06/20/18  Yes Delight Hoh, MD  buPROPion [facility] SR) 100 MG 12 hr tablet TAKE 1 TABLET BY MOUTH TWICE A DAY 07/25/18  Yes Delight Hoh, MD  chlorhexidine (PERIDEX) 0.12 % solution 30 mLs by Mouth Rinse route daily. 09/08/18  Yes [provider]  dexamethasone (DECADRON) 4 MG tablet Take 4 mg by mouth daily. 3 days following chemo 07/18/18  Yes [provider]  lurasidone (LATUDA) 20 MG TABS tablet Take 1 tablet (20 mg total) by mouth Nightly. 06/20/18  Yes Delight Hoh, MD  mirtazapine (REMERON) 7.5 MG tablet TAKE 1 TABLET (7.5 MG TOTAL) BY MOUTH AT BEDTIME. 07/25/18  Yes Delight Hoh, MD  Multiple Vitamins-Minerals (MULTIVITAMIN WITH MINERALS) tablet Take 1 tablet by mouth daily.   Yes [provider]  NEULASTA 6 MG/0.6ML injection  09/17/18  Yes [provider]  OLANZapine (ZYPREXA) 5 MG tablet Take 1-2 tablets (5-10 mg total) by mouth at bedtime. For chemotherapy induced nausea. Patient taking differently: Take 5-10 mg by mouth daily as needed (nausea). For chemotherapy induced nausea. 01/15/18  Yes Tanner, Lyndon Code., PA-C  ondansetron (ZOFRAN) 8 MG tablet Take 8 mg by mouth 3 (three) times daily as needed for nausea/vomiting. 08/09/18  Yes [provider]  Polyethylene Glycol 3350 (PEG 3350) POWD Take 17 g by mouth daily as needed for constipation.   Yes [provider]  prochlorperazine (COMPAZINE) 10 MG tablet Take 1 tablet (10 mg total) by mouth once for 1 dose. Patient taking differently: Take 10 mg by mouth daily as needed for nausea.  01/08/18 10/02/18 Yes Wyatt Portela, MD  tamsulosin (FLOMAX) 0.4 MG CAPS capsule  Take 0.4 mg by mouth daily. 08/27/18  Yes [provider]  vortioxetine HBr (TRINTELLIX) 20 MG TABS tablet Take 1 tablet (20 mg total) by mouth Nightly. 06/20/18  Yes Delight Hoh, MD  LORazepam (ATIVAN) 1 MG tablet Take 1 tablet (1 mg total) by mouth every 8 (eight) hours as needed for anxiety. Patient not taking: Reported on 10/02/2018 01/19/18   Wyatt Portela, MD     Critical care time: 2 mins     Georgann Housekeeper, AGACNP-BC [place] Pager [phone] or [phone]  10/02/2018 8:22 PM

## 2018-10-02 NOTE — ED Notes (Signed)
Date and time results received: 10/02/18 6:31 PM  (use smartphrase ".now" to insert current time)  Test: Lactic acid Critical Value: 6.2  Name of Provider Notified: Nevada Crane  Orders Received? Or Actions Taken?: Actions Taken: awaiting orders and .

## 2018-10-02 NOTE — ED Notes (Signed)
Bedside commode present. Pt reminded of need for urine.

## 2018-10-02 NOTE — ED Provider Notes (Addendum)
[facility] DEPT Provider Note   CSN: [id number] Arrival date & time: 10/02/18  1346     History   Chief Complaint Chief Complaint  Patient presents with  . Fever  . Emesis  . Diarrhea  . Cancer    HPI Ricky Williams is a 23 y.o. male.  24 year old male with history of testicular cancer who last had chemotherapy 9 days ago presents with 1 day history of fever and muscle aches.  He had nonbilious emesis which is since resolved.  Denies any cough or congestion.  No headache or neck pain or photophobia.  No rashes noted.  No urinary symptoms.  No chest abdominal discomfort.  Has been medicating with Tylenol without relief.  T-max at home was 100.7.  [facility] where he received chemo and was told to come here.     Past Medical History:  Diagnosis Date  . Cancer ([place])   . Eczema   . GAD (generalized anxiety disorder)   . Major depression    total 5 electroculsive treatments last one 10-18-2017  . Mass of left testicle   . S/P ECT (electroconvulsive therapy) x5 treatments  last one 10-18-2017  . Wears contact lenses     Patient Active Problem List   Diagnosis Date Noted  . 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 Social anxiety disorder 06/11/2018  . Generalized anxiety disorder 06/11/2018  . 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 Malignant neoplasm of left testis ([place]) 01/08/2018  . Scrotal abscess 01/02/2018  . Well adolescent visit 03/27/2017  . Ingrown toenail 04/02/2016  . Periumbilical abdominal pain 03/10/2014  . Hydrocele 04/02/2013    Past Surgical History:  Procedure Laterality Date  . NO PAST SURGERIES    . ORCHIECTOMY Left 11/01/2017   Procedure: LEFT ORCHIECTOMY;  Surgeon: Ceasar Mons, MD;  Location: [facility];  Service: Urology;  Laterality: Left;         Home Medications    Prior to Admission medications   Medication Sig Start Date End Date Taking? Authorizing Provider  acetaminophen (TYLENOL) 325 MG tablet Take 650 mg by mouth every 6 (six) hours as needed for mild pain or fever.    Yes [provider]  ALPRAZolam (XANAX XR) 0.5 MG 24 hr tablet Take 1 tablet (0.5 mg total) by mouth daily. Patient taking differently: Take 0.5 mg by mouth daily as needed for anxiety.  06/20/18  Yes Delight Hoh, MD  buPROPion [facility] SR) 100 MG 12 hr tablet TAKE 1 TABLET BY MOUTH TWICE A DAY 07/25/18  Yes Delight Hoh, MD  chlorhexidine (PERIDEX) 0.12 % solution 30 mLs by Mouth Rinse route daily. 09/08/18  Yes [provider]  dexamethasone (DECADRON) 4 MG tablet Take 4 mg by mouth daily. 3 days following chemo 07/18/18  Yes [provider]  lurasidone (LATUDA) 20 MG TABS tablet Take 1 tablet (20 mg total) by mouth Nightly. 06/20/18  Yes Delight Hoh, MD  mirtazapine (REMERON) 7.5 MG tablet TAKE 1 TABLET (7.5 MG TOTAL) BY MOUTH AT BEDTIME. 07/25/18  Yes Delight Hoh, MD  Multiple Vitamins-Minerals (MULTIVITAMIN WITH MINERALS) tablet Take 1 tablet by mouth daily.   Yes [provider]  NEULASTA 6 MG/0.6ML injection  09/17/18  Yes [provider]  OLANZapine (ZYPREXA) 5 MG tablet Take 1-2 tablets (5-10 mg total) by mouth at bedtime. For chemotherapy  induced nausea. Patient taking differently: Take 5-10 mg by mouth daily as needed (nausea). For chemotherapy induced nausea. 01/15/18  Yes Tanner, Lyndon Code., PA-C  ondansetron (ZOFRAN) 8 MG tablet Take 8 mg by mouth 3 (three) times daily as needed for nausea/vomiting. 08/09/18  Yes [provider]  Polyethylene Glycol 3350 (PEG 3350) POWD Take 17 g by mouth daily as needed for constipation.   Yes [provider]  prochlorperazine (COMPAZINE) 10 MG tablet Take 1 tablet (10 mg total) by mouth once for 1 dose. Patient taking  differently: Take 10 mg by mouth daily as needed for nausea.  01/08/18 10/02/18 Yes Wyatt Portela, MD  tamsulosin (FLOMAX) 0.4 MG CAPS capsule Take 0.4 mg by mouth daily. 08/27/18  Yes [provider]  vortioxetine HBr (TRINTELLIX) 20 MG TABS tablet Take 1 tablet (20 mg total) by mouth Nightly. 06/20/18  Yes Delight Hoh, MD  LORazepam (ATIVAN) 1 MG tablet Take 1 tablet (1 mg total) by mouth every 8 (eight) hours as needed for anxiety. Patient not taking: Reported on 10/02/2018 01/19/18   Wyatt Portela, MD    Family History Family History  Problem Relation Age of Onset  . Breast cancer Mother   . Prostate cancer Father   . Kidney Stones Father   . Arthritis Father        Psoriatic   . Multiple myeloma Maternal Grandmother   . Melanoma Maternal Grandfather   . Breast cancer Paternal Grandmother   . Healthy Paternal Grandfather     Social History Social History   Tobacco Use  . Smoking status: Never Smoker  . Smokeless tobacco: Never Used  Substance Use Topics  . Alcohol use: No  . Drug use: No     Allergies   Adhesive [tape] and Emend [aprepitant]   Review of Systems Review of Systems  All other systems reviewed and are negative.    Physical Exam Updated Vital Signs BP (!) 93/47   Pulse (!) 129   Temp 100 F (37.8 C) (Oral)   Resp 20   Ht 1.753 m (5' 9")   Wt 59 kg   SpO2 94%   BMI 19.20 kg/m   Physical Exam Vitals signs and nursing note reviewed.  Constitutional:      General: He is not in acute distress.    Appearance: Normal appearance. He is well-developed. He is not toxic-appearing.  HENT:     Head: Normocephalic and atraumatic.  Eyes:     General: Lids are normal.     Conjunctiva/sclera: Conjunctivae normal.     Pupils: Pupils are equal, round, and reactive to light.  Neck:     Musculoskeletal: Normal range of motion and neck supple.     Thyroid: No thyroid mass.     Trachea: No tracheal deviation.  Cardiovascular:     Rate and  Rhythm: Regular rhythm. Tachycardia present.     Heart sounds: Normal heart sounds. No murmur. No gallop.   Pulmonary:     Effort: Pulmonary effort is normal. No respiratory distress.     Breath sounds: Normal breath sounds. No stridor. No decreased breath sounds, wheezing, rhonchi or rales.  Abdominal:     General: Bowel sounds are normal. There is no distension.     Palpations: Abdomen is soft.     Tenderness: There is no abdominal tenderness. There is no rebound.  Musculoskeletal: Normal range of motion.        General: No tenderness.  Skin:  General: Skin is warm and dry.     Findings: No abrasion or rash.  Neurological:     Mental Status: He is alert and oriented to person, place, and time.     GCS: GCS eye subscore is 4. GCS verbal subscore is 5. GCS motor subscore is 6.     Cranial Nerves: No cranial nerve deficit.     Sensory: No sensory deficit.  Psychiatric:        Speech: Speech normal.        Behavior: Behavior normal.      ED Treatments / Results  Labs (all labs ordered are listed, but only abnormal results are displayed) Labs Reviewed  COMPREHENSIVE METABOLIC PANEL - Abnormal; Notable for the following components:      Result Value   Sodium 133 (*)    Glucose, Bld 124 (*)    BUN 22 (*)    Calcium 8.1 (*)    All other components within normal limits  CBC WITH DIFFERENTIAL/PLATELET - Abnormal; Notable for the following components:   WBC 29.6 (*)    RBC 3.43 (*)    Hemoglobin 10.2 (*)    HCT 33.0 (*)    RDW 17.2 (*)    Platelets 121 (*)    nRBC 0.7 (*)    Neutro Abs 23.9 (*)    Lymphs Abs 0.1 (*)    Monocytes Absolute 2.1 (*)    Abs Immature Granulocytes 3.50 (*)    All other components within normal limits  CULTURE, BLOOD (ROUTINE X 2)  CULTURE, BLOOD (ROUTINE X 2)  PROTIME-INR  LACTIC ACID, PLASMA  LACTIC ACID, PLASMA  URINALYSIS, ROUTINE W REFLEX MICROSCOPIC    EKG None  Radiology Dg Chest 2 View  Result Date: 10/02/2018 CLINICAL DATA:   Fever. Receiving chemotherapy for testicular cancer. EXAM: CHEST - 2 VIEW COMPARISON:  02/22/2018 radiograph and prior studies FINDINGS: The cardiomediastinal silhouette is unremarkable. A LEFT Port-A-Cath is identified with tip overlying the SUPERIOR cavoatrial junction. There is no evidence of focal airspace disease, pulmonary edema, suspicious pulmonary nodule/mass, pleural effusion, or pneumothorax. No acute bony abnormalities are identified. IMPRESSION: No active cardiopulmonary disease. Electronically Signed   By: Margarette Canada M.D.   On: 10/02/2018 14:47    Procedures Procedures (including critical care time)  Medications Ordered in ED Medications  sodium chloride 0.9 % bolus 1,000 mL (has no administration in time range)    And  sodium chloride 0.9 % bolus 1,000 mL (has no administration in time range)  ceFEPIme (MAXIPIME) 2 g in sodium chloride 0.9 % 100 mL IVPB (has no administration in time range)  metroNIDAZOLE (FLAGYL) IVPB 500 mg (has no administration in time range)  vancomycin (VANCOCIN) IVPB 1000 mg/200 mL premix (has no administration in time range)  sodium chloride flush (NS) 0.9 % injection 3 mL (3 mLs Intravenous Given 10/02/18 1448)     Initial Impression / Assessment and Plan / ED Course  I have reviewed the triage vital signs and the nursing notes.  Pertinent labs & imaging results that were available during my care of the patient were reviewed by me and considered in my medical decision making (see chart for details).    Code sepsis initiated. Patient with hypotension tachycardia.  Ordered 30 cc bolus of saline.  Patient started on empiric antibiotics.  Chest x-ray without acute findings.  Patient's lactate is elevated.  Does have significant leukocytosis of 29,000.  She is mentating appropriate this time.  Blood pressure responded well to  IV fluids.  Will admit to the hospital   6:45 PM Patient remains hypotensive despite vigorous fluid hydration.  Patient had  increase of his lactate from 2-6.  Discussed the case with Dr. Malvin Johns and patient be started on Levophed at 4 mics and he will contact the intensivist to see the patient  CRITICAL CARE Performed by: Leota Jacobsen Total critical care time: 60 minutes Critical care time was exclusive of separately billable procedures and treating other patients. Critical care was necessary to treat or prevent imminent or life-threatening deterioration. Critical care was time spent personally by me on the following activities: development of treatment plan with patient and/or surrogate as well as nursing, discussions with consultants, evaluation of patient's response to treatment, examination of patient, obtaining history from patient or surrogate, ordering and performing treatments and interventions, ordering and review of laboratory studies, ordering and review of radiographic studies, pulse oximetry and re-evaluation of patient's condition.    . Final Clinical Impressions(s) / ED Diagnoses   Final diagnoses:  None    ED Discharge Orders    None       Lacretia Leigh, MD 10/02/18 1646    Lacretia Leigh, MD 10/02/18 1845

## 2018-10-02 NOTE — ED Notes (Signed)
Pt had just finished using restroom when I made him aware of need for urine specimen

## 2018-10-02 NOTE — Progress Notes (Signed)
A consult was received from an ED physician for vancomycin and cefepime per pharmacy dosing (for an indication other than meningitis). The patient's profile has been reviewed for ht/wt/allergies/indication/available labs. A one time order has been placed for the above antibiotics.  Further antibiotics/pharmacy consults should be ordered by admitting physician if indicated.                       Reuel Boom, PharmD, BCPS [phone] 10/02/2018, 3:41 PM

## 2018-10-02 NOTE — ED Notes (Signed)
Patient transported to X-ray 

## 2018-10-02 NOTE — ED Triage Notes (Signed)
Pt has a hx of cancer and had a fever of 100.6 this morning. Pt reports diarrhea and emesis as well.  Pt is receiving active chemo and last treatment was 9 days ago.

## 2018-10-02 NOTE — ED Notes (Signed)
RN placed blood orders, will obtain with [facility] access.

## 2018-10-02 NOTE — ED Notes (Signed)
Bed: WA11 Expected date:  Expected time:  Means of arrival:  Comments: Triage 1 

## 2018-10-02 NOTE — H&P (Signed)
History and Physical  Ricky Williams JDB:[id number] DOB: 1995/11/28 DOA: 10/02/2018  Referring physician: Dr Zenia Resides PCP: Gaynelle Arabian, MD  Outpatient Specialists: Medical oncology Patient coming from: Home  Chief Complaint: Fever and chills  HPI: Ricky Williams is a 23 y.o. male with medical history significant for chronic anxiety, testicular cancer status post 4 cycles of chemotherapy diagnosed in February 2019 at [facility] where he had his first cancer related surgery.  Last cycle of chemotherapy was 9 days ago at [facility].  Presented to [facility] ED due to persistent fever and chills with onset earlier this morning around 2 AM.  Patient is in the room accompanied by his mother who provides majority of the history due to the patient having shaking chills.  Around 2 AM vomited about 3 times.  His mother checked his temperature which was 100.5 and gave him sublingual Zofran.  Again around 930 started having shaking chills, mother checked his temperature and it was 100.7.  Due to persistent shaking chills and fever, his mother decided to call [facility] who recommended to bring him to the nearest ED for further evaluation and treatment.  ED Course: Upon presentation to the ED, patient is febrile with T-max of 103.1, hypotensive with map of 55 despite receiving boluses of IV fluid, tachycardiac with heart rate in the 120s and tachypneic with respiration rate in the mid 20s.  Chest x-ray unrevealing.  Urine analysis pending.  Blood cultures peripherally x2 in process.  TRH asked to admit.  Review of Systems: Review of systems as noted in the HPI. All other systems reviewed and are negative.   Past Medical History:  Diagnosis Date  . Cancer ([place])   . Eczema   . GAD (generalized anxiety disorder)   . Major depression    total 5 electroculsive treatments last one 10-18-2017  . Mass of left testicle   . S/P ECT (electroconvulsive therapy) x5 treatments  last one 10-18-2017  . Wears contact lenses      Past Surgical History:  Procedure Laterality Date  . NO PAST SURGERIES    . ORCHIECTOMY Left 11/01/2017   Procedure: LEFT ORCHIECTOMY;  Surgeon: Ceasar Mons, MD;  Location: [facility];  Service: Urology;  Laterality: Left;    Social History:  reports that he has never smoked. He has never used smokeless tobacco. He reports that he does not drink alcohol or use drugs.   Allergies  Allergen Reactions  . Adhesive [Tape] Rash  . Emend [Aprepitant]     Family History  Problem Relation Age of Onset  . Breast cancer Mother   . Prostate cancer Father   . Kidney Stones Father   . Arthritis Father        Psoriatic   . Multiple myeloma Maternal Grandmother   . Melanoma Maternal Grandfather   . Breast cancer Paternal Grandmother   . Healthy Paternal Grandfather      Prior to Admission medications   Medication Sig Start Date End Date Taking? Authorizing Provider  acetaminophen (TYLENOL) 325 MG tablet Take 650 mg by mouth every 6 (six) hours as needed for mild pain or fever.    Yes [provider]  ALPRAZolam (XANAX XR) 0.5 MG 24 hr tablet Take 1 tablet (0.5 mg total) by mouth daily. Patient taking differently: Take 0.5 mg by mouth daily as needed for anxiety.  06/20/18  Yes Delight Hoh, MD  buPROPion (WELLBUTRIN SR) 100 MG 12 hr tablet TAKE 1 TABLET BY MOUTH  TWICE A DAY 07/25/18  Yes Delight Hoh, MD  chlorhexidine (PERIDEX) 0.12 % solution 30 mLs by Mouth Rinse route daily. 09/08/18  Yes [provider]  dexamethasone (DECADRON) 4 MG tablet Take 4 mg by mouth daily. 3 days following chemo 07/18/18  Yes [provider]  lurasidone (LATUDA) 20 MG TABS tablet Take 1 tablet (20 mg total) by mouth Nightly. 06/20/18  Yes Delight Hoh, MD  mirtazapine (REMERON) 7.5 MG tablet TAKE 1 TABLET (7.5 MG TOTAL) BY MOUTH AT BEDTIME. 07/25/18  Yes Delight Hoh, MD  Multiple Vitamins-Minerals (MULTIVITAMIN WITH MINERALS) tablet  Take 1 tablet by mouth daily.   Yes [provider]  NEULASTA 6 MG/0.6ML injection  09/17/18  Yes [provider]  OLANZapine (ZYPREXA) 5 MG tablet Take 1-2 tablets (5-10 mg total) by mouth at bedtime. For chemotherapy induced nausea. Patient taking differently: Take 5-10 mg by mouth daily as needed (nausea). For chemotherapy induced nausea. 01/15/18  Yes Tanner, Lyndon Code., PA-C  ondansetron (ZOFRAN) 8 MG tablet Take 8 mg by mouth 3 (three) times daily as needed for nausea/vomiting. 08/09/18  Yes [provider]  Polyethylene Glycol 3350 (PEG 3350) POWD Take 17 g by mouth daily as needed for constipation.   Yes [provider]  prochlorperazine (COMPAZINE) 10 MG tablet Take 1 tablet (10 mg total) by mouth once for 1 dose. Patient taking differently: Take 10 mg by mouth daily as needed for nausea.  01/08/18 10/02/18 Yes Wyatt Portela, MD  tamsulosin (FLOMAX) 0.4 MG CAPS capsule Take 0.4 mg by mouth daily. 08/27/18  Yes [provider]  vortioxetine HBr (TRINTELLIX) 20 MG TABS tablet Take 1 tablet (20 mg total) by mouth Nightly. 06/20/18  Yes Delight Hoh, MD  LORazepam (ATIVAN) 1 MG tablet Take 1 tablet (1 mg total) by mouth every 8 (eight) hours as needed for anxiety. Patient not taking: Reported on 10/02/2018 01/19/18   Wyatt Portela, MD    Physical Exam: BP (!) 93/49   Pulse (!) 127   Temp 100 F (37.8 C) (Oral)   Resp (!) 21   Ht 5' 9"  (1.753 m)   Wt 59 kg   SpO2 99%   BMI 19.20 kg/m   . General: 23 y.o. year-old male well developed well nourished in no acute distress.  Alert and oriented x3.  Shaking chills are visibly present. . Cardiovascular: Regular rate and rhythm with no rubs or gallops.  No thyromegaly or JVD noted.  No lower extremity edema. 2/4 pulses in all 4 extremities. Marland Kitchen Respiratory: Clear to auscultation with no wheezes or rales. Good inspiratory effort.  No oral thrush noted on exam. . Abdomen: Soft nontender nondistended with  normal bowel sounds x4 quadrants. . Muskuloskeletal: No cyanosis, clubbing or edema noted bilaterally . Neuro: CN II-XII intact, strength, sensation, reflexes . Skin: No ulcerative lesions noted or rashes.  Scarring noted on his abdomen with no exudates. Marland Kitchen Psychiatry: Judgement and insight appear normal. Mood is appropriate for condition and setting          Labs on Admission:  Basic Metabolic Panel: Recent Labs  Lab 10/02/18 1427  NA 133*  K 3.7  CL 99  CO2 22  GLUCOSE 124*  BUN 22*  CREATININE 0.96  CALCIUM 8.1*   Liver Function Tests: Recent Labs  Lab 10/02/18 1427  AST 36  ALT 39  ALKPHOS 75  BILITOT 0.7  PROT 6.8  ALBUMIN 4.2   No results for input(s):  LIPASE, AMYLASE in the last 168 hours. No results for input(s): AMMONIA in the last 168 hours. CBC: Recent Labs  Lab 10/02/18 1427  WBC 29.6*  NEUTROABS 23.9*  HGB 10.2*  HCT 33.0*  MCV 96.2  PLT 121*   Cardiac Enzymes: No results for input(s): CKTOTAL, CKMB, CKMBINDEX, TROPONINI in the last 168 hours.  BNP (last 3 results) No results for input(s): BNP in the last 8760 hours.  ProBNP (last 3 results) No results for input(s): PROBNP in the last 8760 hours.  CBG: No results for input(s): GLUCAP in the last 168 hours.  Radiological Exams on Admission: Dg Chest 2 View  Result Date: 10/02/2018 CLINICAL DATA:  Fever. Receiving chemotherapy for testicular cancer. EXAM: CHEST - 2 VIEW COMPARISON:  02/22/2018 radiograph and prior studies FINDINGS: The cardiomediastinal silhouette is unremarkable. A LEFT Port-A-Cath is identified with tip overlying the SUPERIOR cavoatrial junction. There is no evidence of focal airspace disease, pulmonary edema, suspicious pulmonary nodule/mass, pleural effusion, or pneumothorax. No acute bony abnormalities are identified. IMPRESSION: No active cardiopulmonary disease. Electronically Signed   By: Margarette Canada M.D.   On: 10/02/2018 14:47    EKG: I independently viewed the EKG  done and my findings are as followed: Sinus tachycardia rate of 110.  No specific ST-T changes.  Assessment/Plan Present on Admission: . Sepsis ([place])  Active Problems:   Sepsis ([place])  Sepsis of unclear etiology in the setting of immunosuppression Presented with a fever with T-max of 103.1, leukocytosis with WBC of 26,000, tachycardia, and tachypnea Chest x-ray independently reviewed clear with no lobular infiltrates Urine analysis and urine culture pending Blood cultures x2 peripherally in process IV antibiotics started empirically in the ED, continue Elevated lactic acid 2.1 Continue IV fluid hydration Monitor fever curve and WBC Monitor urine output Repeat CBC in the morning  Hypotension secondary to sepsis versus others Maintain map above 65 Continue IV fluid hydration Closely monitor vital signs on telemetry  Monitor renal function in the setting of hypotension, obtain BMP in the morning  Sinus tachycardia Heart rate in the 120s If persistent despite fluid hydration may consider ordering a CTA to rule out PE Obtain bilateral lower extremity Doppler ultrasound to rule out DVT in the setting of ongoing cancer with recent chemotherapy  Testicular cancer status post 4 cycles of chemotherapy Diagnosed at [facility] in February 2019 Received 4 cycles of chemotherapy since February 2019 Last cycle of chemotherapy was 9 days ago at [facility] Dr Lurlean Leyden agreed to see the patient per ED provider Dr. Zenia Resides if needed  Chronic anxiety Resume home medications  Thrombocytopenia/normocytic anemia Suspect secondary to chemotherapy No signs of overt mucosal bleed Hemoglobin stable Repeat CBC in the morning   Risks: High risk for decompensation due to sepsis with unclear source of infection in the state of immunosuppression.  Patient will require at least 2 midnights for further evaluation and treatment of present condition.    DVT prophylaxis: Subcu Lovenox 40 mg  daily  Code Status: Full code  Family Communication: Mother at bedside.  All questions answered to her satisfaction.  Disposition Plan: Admit to telemetry unit  Consults called: ED provider Dr. Zenia Resides contacted heme oncology Dr. Lurlean Leyden  Admission status: Inpatient status    Kayleen Memos MD [facility] Pager [phone]  If 7PM-7AM, please contact night-coverage [email or web address] Password [facility]  10/02/2018, 6:07 PM

## 2018-10-03 ENCOUNTER — Ambulatory Visit: Payer: 59 | Admitting: Psychology

## 2018-10-03 LAB — COMPREHENSIVE METABOLIC PANEL
ALT: 34 U/L (ref 0–44)
AST: 29 U/L (ref 15–41)
Albumin: 2.9 g/dL — ABNORMAL LOW (ref 3.5–5.0)
Alkaline Phosphatase: 46 U/L (ref 38–126)
Anion gap: 4 — ABNORMAL LOW (ref 5–15)
BUN: 11 mg/dL (ref 6–20)
CHLORIDE: 112 mmol/L — AB (ref 98–111)
CO2: 21 mmol/L — ABNORMAL LOW (ref 22–32)
Calcium: 6.9 mg/dL — ABNORMAL LOW (ref 8.9–10.3)
Creatinine, Ser: 0.7 mg/dL (ref 0.61–1.24)
GFR calc non Af Amer: >60 mL/min (ref >60)
Glucose, Bld: 111 mg/dL — ABNORMAL HIGH (ref 70–99)
Potassium: 3.1 mmol/L — ABNORMAL LOW (ref 3.5–5.1)
Sodium: 137 mmol/L (ref 135–145)
Total Bilirubin: 0.3 mg/dL (ref 0.3–1.2)
Total Protein: 5 g/dL — ABNORMAL LOW (ref 6.5–8.1)

## 2018-10-03 LAB — GASTROINTESTINAL PANEL BY PCR, STOOL (REPLACES STOOL CULTURE)
Adenovirus F40/41: NOT DETECTED
Astrovirus: NOT DETECTED
Campylobacter species: NOT DETECTED
Cryptosporidium: NOT DETECTED
Cyclospora cayetanensis: NOT DETECTED
ENTAMOEBA HISTOLYTICA: NOT DETECTED
ENTEROAGGREGATIVE E COLI (EAEC): NOT DETECTED
ENTEROPATHOGENIC E COLI (EPEC): NOT DETECTED
Enterotoxigenic E coli (ETEC): NOT DETECTED
GIARDIA LAMBLIA: NOT DETECTED
Norovirus GI/GII: DETECTED — AB
Plesimonas shigelloides: NOT DETECTED
Rotavirus A: NOT DETECTED
SHIGA LIKE TOXIN PRODUCING E COLI (STEC): NOT DETECTED
Salmonella species: NOT DETECTED
Sapovirus (I, II, IV, and V): NOT DETECTED
Shigella/Enteroinvasive E coli (EIEC): NOT DETECTED
Vibrio cholerae: NOT DETECTED
Vibrio species: NOT DETECTED
Yersinia enterocolitica: NOT DETECTED

## 2018-10-03 LAB — CBC WITH DIFFERENTIAL/PLATELET
Abs Immature Granulocytes: 4.45 10*3/uL — ABNORMAL HIGH (ref 0.00–0.07)
BASOS ABS: 0.1 10*3/uL (ref 0.0–0.1)
Basophils Relative: 1 %
Eosinophils Absolute: 0 10*3/uL (ref 0.0–0.5)
Eosinophils Relative: 0 %
HCT: 24.8 % — ABNORMAL LOW (ref 39.0–52.0)
Hemoglobin: 7.7 g/dL — ABNORMAL LOW (ref 13.0–17.0)
Immature Granulocytes: 26 %
Lymphocytes Relative: 2 %
Lymphs Abs: 0.4 10*3/uL — ABNORMAL LOW (ref 0.7–4.0)
MCH: 30.2 pg (ref 26.0–34.0)
MCHC: 31 g/dL (ref 30.0–36.0)
MCV: 97.3 fL (ref 80.0–100.0)
Monocytes Absolute: 0.8 10*3/uL (ref 0.1–1.0)
Monocytes Relative: 5 %
NEUTROS PCT: 66 %
Neutro Abs: 11.3 10*3/uL — ABNORMAL HIGH (ref 1.7–7.7)
PLATELETS: 86 10*3/uL — AB (ref 150–400)
RBC: 2.55 MIL/uL — ABNORMAL LOW (ref 4.22–5.81)
RDW: 17.4 % — ABNORMAL HIGH (ref 11.5–15.5)
WBC: 17 10*3/uL — ABNORMAL HIGH (ref 4.0–10.5)
nRBC: 0.4 % — ABNORMAL HIGH (ref 0.0–0.2)

## 2018-10-03 LAB — GLUCOSE, CAPILLARY: Glucose-Capillary: 103 mg/dL — ABNORMAL HIGH (ref 70–99)

## 2018-10-03 LAB — MRSA PCR SCREENING: MRSA by PCR: NEGATIVE

## 2018-10-03 MED ORDER — SODIUM CHLORIDE 0.9 % IV SOLN: 500.0000 mL | Freq: Once | INTRAVENOUS | Status: DC

## 2018-10-03 MED ORDER — KETOROLAC TROMETHAMINE 30 MG/ML IJ SOLN: 30.0000 mg | Freq: Once | INTRAMUSCULAR | Status: DC

## 2018-10-03 MED ORDER — MIDAZOLAM HCL 2 MG/2ML IJ SOLN: 2.0000 mg | Freq: Once | INTRAMUSCULAR | Status: DC

## 2018-10-03 MED ORDER — NOREPINEPHRINE BITARTRATE 1 MG/ML IV SOLN: 0.0000 ug/min | INTRAVENOUS | Status: DC

## 2018-10-03 MED ORDER — ONDANSETRON HCL 4 MG/2ML IJ SOLN: 4.0000 mg | Freq: Once | INTRAMUSCULAR | Status: DC

## 2018-10-03 MED ORDER — SODIUM CHLORIDE 0.9% FLUSH
10.0000 mL | Freq: Two times a day (BID) | INTRAVENOUS | Status: DC
  Administered 2018-10-03 – 2018-10-05 (×2): 10 mL

## 2018-10-03 MED ORDER — CHLORHEXIDINE GLUCONATE CLOTH 2 % EX PADS
6.0000 | MEDICATED_PAD | Freq: Every day | CUTANEOUS | Status: DC
  Administered 2018-10-03: 6 via TOPICAL

## 2018-10-03 MED ORDER — LACTATED RINGERS IV BOLUS
500.0000 mL | Freq: Once | INTRAVENOUS | Status: AC
  Administered 2018-10-03: 500 mL via INTRAVENOUS

## 2018-10-03 MED ORDER — BUPROPION HCL ER (SR) 100 MG PO TB12
100.0000 mg | ORAL_TABLET | Freq: Two times a day (BID) | ORAL | Status: DC
  Administered 2018-10-03 – 2018-10-06 (×7): 100 mg via ORAL
  Filled 2018-10-03 (×8): qty 1

## 2018-10-03 MED ORDER — LURASIDONE HCL 20 MG PO TABS
20.0000 mg | ORAL_TABLET | Freq: Every day | ORAL | Status: DC
  Administered 2018-10-03 – 2018-10-05 (×3): 20 mg via ORAL
  Filled 2018-10-03 (×3): qty 1

## 2018-10-03 MED ORDER — ALPRAZOLAM 0.5 MG PO TABS
0.5000 mg | ORAL_TABLET | Freq: Every day | ORAL | Status: DC
  Administered 2018-10-03 – 2018-10-06 (×2): 0.5 mg via ORAL
  Filled 2018-10-03 (×2): qty 1

## 2018-10-03 MED ORDER — POTASSIUM CHLORIDE 10 MEQ/50ML IV SOLN
10.0000 meq | INTRAVENOUS | Status: AC
  Administered 2018-10-03 (×3): 10 meq via INTRAVENOUS
  Filled 2018-10-03 (×3): qty 50

## 2018-10-03 MED ORDER — HYDROCORTISONE NA SUCCINATE PF 100 MG IJ SOLR
50.0000 mg | Freq: Three times a day (TID) | INTRAMUSCULAR | Status: DC
  Administered 2018-10-03 – 2018-10-04 (×3): 50 mg via INTRAVENOUS
  Filled 2018-10-03: qty 1
  Filled 2018-10-03: qty 2
  Filled 2018-10-03 (×2): qty 1

## 2018-10-03 MED ORDER — LACTATED RINGERS IV SOLN
INTRAVENOUS | Status: DC
  Administered 2018-10-03 – 2018-10-05 (×4): via INTRAVENOUS

## 2018-10-03 MED ORDER — VORTIOXETINE HBR 5 MG PO TABS
20.0000 mg | ORAL_TABLET | Freq: Every day | ORAL | Status: DC
  Administered 2018-10-03 – 2018-10-05 (×3): 20 mg via ORAL
  Filled 2018-10-03 (×3): qty 4

## 2018-10-03 MED ORDER — SODIUM CHLORIDE 0.9% FLUSH: 10.0000 mL | INTRAVENOUS | Status: DC | PRN

## 2018-10-03 MED ORDER — ALBUMIN HUMAN 5 % IV SOLN
25.0000 g | Freq: Once | INTRAVENOUS | Status: AC
  Administered 2018-10-03: 25 g via INTRAVENOUS
  Filled 2018-10-03: qty 500

## 2018-10-03 MED ORDER — SODIUM CHLORIDE 0.9 % IV SOLN
0.0000 ug/min | INTRAVENOUS | Status: DC
  Administered 2018-10-03: 12 ug/min via INTRAVENOUS
  Filled 2018-10-03: qty 4

## 2018-10-03 MED ORDER — TAMSULOSIN HCL 0.4 MG PO CAPS
0.4000 mg | ORAL_CAPSULE | Freq: Every day | ORAL | Status: DC
  Administered 2018-10-03 – 2018-10-06 (×4): 0.4 mg via ORAL
  Filled 2018-10-03 (×4): qty 1

## 2018-10-03 NOTE — ED Notes (Signed)
ED TO INPATIENT HANDOFF REPORT  Name/Age/Gender Ricky Williams 23 y.o. male  Code Status    Code Status Orders  (From admission, onward)         Start     Ordered   10/02/18 2028  Full code  Continuous     10/02/18 2029        Code Status History    Date Active Date Inactive Code Status Order ID Comments User Context   10/02/2018 1805 10/02/2018 2029 Full Code [id number]  Kayleen Memos DO ED      Home/SNF/Other Home  Chief Complaint Fever; Ca Ppt  Level of Care/Admitting Diagnosis ED Disposition    ED Disposition Condition [facility] Area: [facility] [[id number]]  Level of Care: ICU [6]  Diagnosis: Septic shock [facility]) [[id number]]  Admitting Physician: Chesley Mires [[id number]]  Attending Physician: Chesley Mires [[id number]]  Estimated length of stay: 5 - 7 days  Certification:: I certify this patient will need inpatient services for at least 2 midnights  PT Class (Do Not Modify): Inpatient [101]  PT Acc Code (Do Not Modify): Private [1]       Medical History Past Medical History:  Diagnosis Date  . Cancer ([place])   . Eczema   . GAD (generalized anxiety disorder)   . Major depression    total 5 electroculsive treatments last one 10-18-2017  . Mass of left testicle   . S/P ECT (electroconvulsive therapy) x5 treatments  last one 10-18-2017  . Wears contact lenses     Allergies Allergies  Allergen Reactions  . Adhesive [Tape] Rash  . Emend [Aprepitant]     IV Location/Drains/Wounds Patient Lines/Drains/Airways Status   Active Line/Drains/Airways    Name:   Placement date:   Placement time:   Site:   Days:   Implanted Port Left Chest   -    -    Chest      Peripheral IV 10/02/18 Right Antecubital   10/02/18    1915    Antecubital   1   Incision (Closed) 11/01/17 Groin Left   11/01/17    0833     336          Labs/Imaging Results for orders placed or performed during the hospital encounter of 10/02/18 (from the past 48 hour(s))   Comprehensive metabolic panel     Status: Abnormal   Collection Time: 10/02/18  2:27 PM  Result Value Ref Range   Sodium 133 (L) 135 - 145 mmol/L   Potassium 3.7 3.5 - 5.1 mmol/L   Chloride 99 98 - 111 mmol/L   CO2 22 22 - 32 mmol/L   Glucose, Bld 124 (H) 70 - 99 mg/dL   BUN 22 (H) 6 - 20 mg/dL   Creatinine, Ser 0.96 0.61 - 1.24 mg/dL   Calcium 8.1 (L) 8.9 - 10.3 mg/dL   Total Protein 6.8 6.5 - 8.1 g/dL   Albumin 4.2 3.5 - 5.0 g/dL   AST 36 15 - 41 U/L   ALT 39 0 - 44 U/L   Alkaline Phosphatase 75 38 - 126 U/L   Total Bilirubin 0.7 0.3 - 1.2 mg/dL   GFR calc non Af Amer >60 >60 mL/min   GFR calc Af Amer >60 >60 mL/min   Anion gap 12 5 - 15    Comment: Performed at [facility], [place] [address], [place], [place] [address]  CBC with Differential     Status: Abnormal  Collection Time: 10/02/18  2:27 PM  Result Value Ref Range   WBC 29.6 (H) 4.0 - 10.5 K/uL    Comment: REPEATED TO VERIFY WHITE COUNT CONFIRMED ON SMEAR    RBC 3.43 (L) 4.22 - 5.81 MIL/uL   Hemoglobin 10.2 (L) 13.0 - 17.0 g/dL   HCT 33.0 (L) 39.0 - 52.0 %   MCV 96.2 80.0 - 100.0 fL   MCH 29.7 26.0 - 34.0 pg   MCHC 30.9 30.0 - 36.0 g/dL   RDW 17.2 (H) 11.5 - 15.5 %   Platelets 121 (L) 150 - 400 K/uL    Comment: REPEATED TO VERIFY PLATELET COUNT CONFIRMED BY SMEAR Immature Platelet Fraction may be clinically indicated, consider ordering this additional test [id number]    nRBC 0.7 (H) 0.0 - 0.2 %   Neutrophils Relative % 81 %   Neutro Abs 23.9 (H) 1.7 - 7.7 K/uL   Lymphocytes Relative 0 %   Lymphs Abs 0.1 (L) 0.7 - 4.0 K/uL   Monocytes Relative 7 %   Monocytes Absolute 2.1 (H) 0.1 - 1.0 K/uL   Eosinophils Relative 0 %   Eosinophils Absolute 0.0 0.0 - 0.5 K/uL   Basophils Relative 0 %   Basophils Absolute 0.0 0.0 - 0.1 K/uL   WBC Morphology INCREASED BANDS (>20% BANDS)     Comment: MODERATE LEFT SHIFT (>5% METAS AND MYELOS,OCC PRO NOTED) TOXIC GRANULATION    Smear Review PLATELET  COUNT CONFIRMED BY SMEAR    Immature Granulocytes 12 %   Abs Immature Granulocytes 3.50 (H) 0.00 - 0.07 K/uL   Polychromasia PRESENT     Comment: Performed at [facility], [place] [address], [place], [place] [address]  Protime-INR     Status: None   Collection Time: 10/02/18  2:27 PM  Result Value Ref Range   Prothrombin Time 14.2 11.4 - 15.2 seconds   INR 1.11     Comment: Performed at [facility], [place] [address], [place], [place] [address]  Lactic acid, plasma     Status: Abnormal   Collection Time: 10/02/18  2:50 PM  Result Value Ref Range   Lactic Acid, Venous 2.1 (HH) 0.5 - 1.9 mmol/L    Comment: CRITICAL RESULT CALLED TO, READ BACK BY AND VERIFIED WITH: S.BINGHAM AT 1550 ON 10/02/18 BY N.THOMPSON Performed at [facility], [place] [address] [place], [place] [address]   Influenza panel by PCR (type A & B)     Status: None   Collection Time: 10/02/18  3:36 PM  Result Value Ref Range   Influenza A By PCR NEGATIVE NEGATIVE   Influenza B By PCR NEGATIVE NEGATIVE    Comment: (NOTE) The Xpert Xpress Flu assay is intended as an aid in the diagnosis of  influenza and should not be used as a sole basis for treatment.  This  assay is FDA approved for nasopharyngeal swab specimens only. Nasal  washings and aspirates are unacceptable for Xpert Xpress Flu testing. Performed at [facility], [place] [address], [place], [place] [address]   Lactic acid, plasma     Status: Abnormal   Collection Time: 10/02/18  5:26 PM  Result Value Ref Range   Lactic Acid, Venous 6.2 (HH) 0.5 - 1.9 mmol/L    Comment: CRITICAL RESULT CALLED TO, READ BACK BY AND VERIFIED WITH: M.BRILL AT 1825 ON 10/02/18 BY N.THOMPSON Performed at [facility], [place] [address], [place], [place] [address]   Urinalysis, Routine w reflex  microscopic     Status: Abnormal   Collection Time: 10/02/18  5:26 PM  Result Value Ref Range   Color,  Urine STRAW (A) YELLOW   APPearance CLEAR CLEAR   Specific Gravity, Urine 1.005 1.005 - 1.030   pH 6.0 5.0 - 8.0   Glucose, UA NEGATIVE NEGATIVE mg/dL   Hgb urine dipstick LARGE (A) NEGATIVE   Bilirubin Urine NEGATIVE NEGATIVE   Ketones, ur NEGATIVE NEGATIVE mg/dL   Protein, ur NEGATIVE NEGATIVE mg/dL   Nitrite NEGATIVE NEGATIVE   Leukocytes, UA NEGATIVE NEGATIVE   RBC / HPF 11-20 0 - 5 RBC/hpf   WBC, UA 0-5 0 - 5 WBC/hpf   Bacteria, UA RARE (A) NONE SEEN    Comment: Performed at [facility], [place] [address], [place], [place] [address]  C difficile quick scan w PCR reflex     Status: None   Collection Time: 10/02/18  9:16 PM  Result Value Ref Range   C Diff antigen NEGATIVE NEGATIVE   C Diff toxin NEGATIVE NEGATIVE   C Diff interpretation No C. difficile detected.     Comment: Performed at [facility], [place] [address] [place], [place] [address]  Lactic acid, plasma     Status: None   Collection Time: 10/02/18 11:00 PM  Result Value Ref Range   Lactic Acid, Venous 0.8 0.5 - 1.9 mmol/L    Comment: Performed at [facility], [place] [address], [place], [place] [address]   Dg Chest 2 View  Result Date: 10/02/2018 CLINICAL DATA:  Fever. Receiving chemotherapy for testicular cancer. EXAM: CHEST - 2 VIEW COMPARISON:  02/22/2018 radiograph and prior studies FINDINGS: The cardiomediastinal silhouette is unremarkable. A LEFT Port-A-Cath is identified with tip overlying the SUPERIOR cavoatrial junction. There is no evidence of focal airspace disease, pulmonary edema, suspicious pulmonary nodule/mass, pleural effusion, or pneumothorax. No acute bony abnormalities are identified. IMPRESSION: No active cardiopulmonary disease. Electronically Signed   By: Margarette Canada M.D.   On: 10/02/2018 14:47   Ct Angio Chest Pe W Or Wo Contrast  Result Date: 10/02/2018 CLINICAL DATA:  23 y.o. male with medical history significant for chronic anxiety,  testicular cancer status post 4 cycles of chemotherapy diagnosed in February 2019 at [facility] where he had his first cancer related surgery. Last cycle of chemotherapy was 9 days ago at [facility]. Presented to [facility] ED due to persistent fever and chills with onset earlier this morning around 2 AM. EXAM: CT ANGIOGRAPHY CHEST WITH CONTRAST TECHNIQUE: Multidetector CT imaging of the chest was performed using the standard protocol during bolus administration of intravenous contrast. Multiplanar CT image reconstructions and MIPs were obtained to evaluate the vascular anatomy. CONTRAST:  131mL ISOVUE-370 IOPAMIDOL (ISOVUE-370) INJECTION 76% COMPARISON:  Current chest radiograph.  Chest CT, 11/09/2017. FINDINGS: Cardiovascular: There is satisfactory opacification the pulmonary arteries to the segmental level. There is no evidence of a pulmonary embolism. Heart is normal in size and configuration. No pericardial effusion. Great vessels are normal in caliber. Mediastinum/Nodes: No neck base, axillary, mediastinal or hilar masses or enlarged lymph nodes. Left anterior chest wall Port-A-Cath extends through the left subclavian vein, tip lying in the right atrium. Trachea and esophagus are unremarkable. Lungs/Pleura: Small nodule in the anterior inferior right middle lobe, with ill-defined margins, measuring 7 x 5 mm, mean 6 mm. This is new from the prior CT. It may reflect a small focus atelectasis. No other nodules. Subtle ground-glass opacity in the anteromedial left upper  lobe, likely minor atelectasis. Remainder of the lungs is clear. No pleural effusion or pneumothorax. Upper Abdomen: No acute abnormality. Musculoskeletal: No chest wall abnormality. No acute or significant osseous findings. Review of the MIP images confirms the above findings. IMPRESSION: 1. No evidence of a pulmonary embolism. 2. Subtle area ground-glass opacity in the anteromedial left upper lobe. This is most likely minor atelectasis. Infection is  felt unlikely. 3. 6 mm nodule in the anteromedial right middle lobe. This is atelectasis or inflammatory. Recommend chest CT in 3 months to reassess this lesion for resolution. 4. No other abnormalities in the chest. Electronically Signed   By: Lajean Manes M.D.   On: 10/02/2018 19:55   EKG Interpretation  Date/Time:  Tuesday October 02 2018 15:41:44 EST Ventricular Rate:  110 PR Interval:    QRS Duration: 70 QT Interval:  309 QTC Calculation: 418 R Axis:   73 Text Interpretation:  Sinus tachycardia Atrial premature complex LAE, consider biatrial enlargement Confirmed by Lacretia Leigh ([id number]) on 10/02/2018 5:31:30 PM   Pending Labs Unresulted Labs (From admission, onward)    Start     Ordered   10/03/18 0500  Comprehensive metabolic panel  Tomorrow morning,   R     10/02/18 2012   10/03/18 0500  CBC with Differential/Platelet  Tomorrow morning,   R     10/02/18 2012   10/02/18 2011  Gastrointestinal Panel by PCR , Stool  (Gastrointestinal Panel by PCR, Stool)  Once,   R     10/02/18 2010   10/02/18 1722  Urine Culture  ONCE - STAT,   STAT     10/02/18 1721   10/02/18 1402  Culture, blood (Routine x 2)  BLOOD CULTURE X 2,   STAT     10/02/18 1401          Vitals/Pain Today's Vitals   10/03/18 0030 10/03/18 0100 10/03/18 0146 10/03/18 0147  BP: (!) 97/46 (!) 95/43 (!) 99/54   Pulse: 91 97 96   Resp: 15 18 16    Temp:      TempSrc:      SpO2: 96% 96% 98%   Weight:      Height:      PainSc:    0-No pain    Isolation Precautions Enteric precautions (UV disinfection)  Medications Medications  enoxaparin (LOVENOX) injection 40 mg (40 mg Subcutaneous Given 10/02/18 2258)  0.9 %  sodium chloride infusion ( Intravenous New Bag/Given 10/02/18 2006)  acetaminophen (TYLENOL) tablet 650 mg (has no administration in time range)  norepinephrine (LEVOPHED) 4 mg in dextrose 5 % 250 mL (0.016 mg/mL) infusion (4 mcg/min Intravenous New Bag/Given 10/02/18 2001)  vancomycin (VANCOCIN)  IVPB 1000 mg/200 mL premix (has no administration in time range)  hydrocortisone sodium succinate (SOLU-CORTEF) 100 MG injection 50 mg (50 mg Intravenous Given 10/02/18 2020)  metroNIDAZOLE (FLAGYL) IVPB 500 mg (500 mg Intravenous New Bag/Given 10/03/18 0018)  ondansetron (ZOFRAN) injection 4 mg (has no administration in time range)  ciprofloxacin (CIPRO) IVPB 400 mg (0 mg Intravenous Stopped 10/03/18 0016)  sodium chloride flush (NS) 0.9 % injection 3 mL (3 mLs Intravenous Given 10/02/18 1448)  sodium chloride 0.9 % bolus 1,000 mL (0 mLs Intravenous Stopped 10/02/18 1705)    And  sodium chloride 0.9 % bolus 1,000 mL (0 mLs Intravenous Stopped 10/02/18 1807)  ceFEPIme (MAXIPIME) 2 g in sodium chloride 0.9 % 100 mL IVPB (0 g Intravenous Stopped 10/02/18 1641)  vancomycin (VANCOCIN) IVPB 1000 mg/200 mL premix (0  mg Intravenous Stopped 10/02/18 1956)  acetaminophen (TYLENOL) tablet 650 mg (650 mg Oral Given 10/02/18 1724)  sodium chloride 0.9 % bolus 1,000 mL (0 mLs Intravenous Stopped 10/02/18 1957)  iopamidol (ISOVUE-370) 76 % injection 100 mL (100 mLs Intravenous Contrast Given 10/02/18 1927)    Mobility walks

## 2018-10-03 NOTE — Progress Notes (Signed)
   NAME:  Ricky Williams, MRN:  [id number], DOB:  1996-04-16, LOS: 1 ADMISSION DATE:  10/02/2018, CONSULTATION DATE:  1/28 REFERRING MD:  Dr. Nevada Crane [facility], CHIEF COMPLAINT:  Septic shock  Brief History   23 year old male with recent dx testicular cancer having just undergone cycle 4 of IE chemotherapy now admitted for septic shock on 1/28.  Unknown source.  Past Medical History   has a past medical history of Cancer ([place]), Eczema, GAD (generalized anxiety disorder), Major depression, Mass of left testicle, S/P ECT (electroconvulsive therapy) (x5 treatments  last one 10-18-2017), and Wears contact lenses.  Significant Hospital Events   Admit 1/28 > 1/29 off pressors wants to eat. Stool + for norovirus  Consults:    Procedures:    Significant Diagnostic Tests:  CTA chest 1/28 > No evidence of a pulmonary embolism. Subtle area ground-glass opacity in the anteromedial left upper lobe. This is most likely minor atelectasis. Infection is felt unlikely. 3. 6 mm nodule in the anteromedial right middle lobe. This is atelectasis or inflammatory.   Micro Data:  Blood 1/28 > Urine 1/28 > Stool: + for norovirus  Antimicrobials:  Cefepime 1/28 (ED) Ciproloxacin 1/28 > Flagyl 1/28 > Vancomycin 1/28 >  Interim history/subjective:  No more nausea or vomiting   Objective   Blood pressure 113/64, pulse (Abnormal) 108, temperature 98.8 F (37.1 C), temperature source Oral, resp. rate 14, height 5\' 9"  (1.753 m), weight 66.4 kg, SpO2 100 %.        Intake/Output Summary (Last 24 hours) at 10/03/2018 1140 Last data filed at 10/03/2018 4098 Gross per 24 hour  Intake 4799.17 ml  Output 650 ml  Net 4149.17 ml   Filed Weights   10/02/18 1400 10/02/18 1504 10/03/18 0235  Weight: 59 kg 59 kg 66.4 kg    Examination: General: Pleasant resting in bed in no acute distress HEENT diffuse alopecia, mucous membranes moist Pulmonary: Clear to auscultation Abdomen: Soft nontender Cardiac: Regular rate and  rhythm GU: Voids Extremities: Warm dry  Resolved Hospital Problem list     Assessment & Plan:   Septic shock 2/2 norovirus in immunocompromised host (h/o testicular cancer s/p recent chemo) -now off pressors -cbc improved  Plan Dc vanc Cont cipro/flagyl for now but can prob dc in am (if all other cultures neg) Taper stress dose steroids over the next 5 days Cont IVFs Systolic goal blood pressure 90 or greater  Fluid and electrolyte imbalance: hypokalemia ; Hyperchloremia ; NAG metabolic acidosis  Plan Change IVF to LR  Adv diet   Testicular cancer: currently undergoing [facility].  Plan [facility] at [facility]   Nausea/Vomiting Plan PRN rx   Anemia & Thrombocytopenia both likely secondary to chemotherapy, and now acute illness.  Plan Trend cbc    Anxiety & Depression long complicated history of both Had been Holding home xanax, wellbutrin, latuda, remeron, zyprexa, Trintellix  Plan Resume home rx  Pulmonary nodule: RML identified on CTA 1/28 Plan Out pt f/u at [facility]   Best practice:  Diet: Clears Pain/Anxiety/Delirium protocol (if indicated): Na VAP protocol (if indicated): Na DVT prophylaxis: Enoxaparin GI prophylaxis: Na Glucose control:  Mobility: up with assist Code Status: FULL Family Communication: Patient and parents updated in ED Disposition: Transfer to stepdown.     Critical care time:     Erick Colace ACNP-BC [place] Pager # [phone] OR # [phone] if no answer

## 2018-10-03 NOTE — Progress Notes (Signed)
[place] Progress Note Patient Name: JAZMINE LONGSHORE DOB: 13-Dec-1995 MRN: [id number]   Date of Service  10/03/2018  HPI/Events of Note  K+ 3.1, Calcium 6.9, albumin 2.9  eICU Interventions  Elink K+ replacement protocol, Calcium gluconate 2 gm iv x 1        Lenise Jr U Cornelius Schuitema 10/03/2018, 6:54 AM

## 2018-10-03 NOTE — Progress Notes (Signed)
[place] Progress Note Patient Name: Ricky Williams DOB: 08-16-1996 MRN: [id number]   Date of Service  10/03/2018  HPI/Events of Note  Hypotension and elevated Lactate despite 3 liters of fluid in the ED  eICU Interventions  LR 500 ml iv bolus x 1, 5  % Albumin 500 ml iv bolus x 1, Norepinephrine infusion changed to titratable dosing for MAP > 65 mm mercury        Plez Belton U Deyra Perdomo 10/03/2018, 2:57 AM

## 2018-10-04 ENCOUNTER — Other Ambulatory Visit: Payer: Self-pay

## 2018-10-04 DIAGNOSIS — D899 Disorder involving the immune mechanism, unspecified: Secondary | ICD-10-CM

## 2018-10-04 DIAGNOSIS — D849 Immunodeficiency, unspecified: Secondary | ICD-10-CM | POA: Diagnosis present

## 2018-10-04 DIAGNOSIS — C6290 Malignant neoplasm of unspecified testis, unspecified whether descended or undescended: Secondary | ICD-10-CM

## 2018-10-04 DIAGNOSIS — Z79899 Other long term (current) drug therapy: Secondary | ICD-10-CM

## 2018-10-04 DIAGNOSIS — A084 Viral intestinal infection, unspecified: Secondary | ICD-10-CM

## 2018-10-04 DIAGNOSIS — D84821 Immunodeficiency due to drugs: Secondary | ICD-10-CM

## 2018-10-04 DIAGNOSIS — K529 Noninfective gastroenteritis and colitis, unspecified: Secondary | ICD-10-CM

## 2018-10-04 LAB — BASIC METABOLIC PANEL
Anion gap: 7 (ref 5–15)
BUN: 5 mg/dL — AB (ref 6–20)
CO2: 24 mmol/L (ref 22–32)
Calcium: 8.2 mg/dL — ABNORMAL LOW (ref 8.9–10.3)
Chloride: 109 mmol/L (ref 98–111)
Creatinine, Ser: 0.67 mg/dL (ref 0.61–1.24)
GFR calc Af Amer: >60 mL/min (ref >60)
Glucose, Bld: 121 mg/dL — ABNORMAL HIGH (ref 70–99)
Potassium: 3.2 mmol/L — ABNORMAL LOW (ref 3.5–5.1)
Sodium: 140 mmol/L (ref 135–145)

## 2018-10-04 LAB — URINE CULTURE: Culture: NO GROWTH

## 2018-10-04 LAB — CBC
HCT: 24.5 % — ABNORMAL LOW (ref 39.0–52.0)
Hemoglobin: 7.6 g/dL — ABNORMAL LOW (ref 13.0–17.0)
MCH: 29.7 pg (ref 26.0–34.0)
MCHC: 31 g/dL (ref 30.0–36.0)
MCV: 95.7 fL (ref 80.0–100.0)
Platelets: 80 10*3/uL — ABNORMAL LOW (ref 150–400)
RBC: 2.56 MIL/uL — ABNORMAL LOW (ref 4.22–5.81)
RDW: 17.4 % — ABNORMAL HIGH (ref 11.5–15.5)
WBC: 19 10*3/uL — ABNORMAL HIGH (ref 4.0–10.5)
nRBC: 0.2 % (ref 0.0–0.2)

## 2018-10-04 MED ORDER — HYDROCORTISONE NA SUCCINATE PF 100 MG IJ SOLR
25.0000 mg | Freq: Three times a day (TID) | INTRAMUSCULAR | Status: DC
  Filled 2018-10-04: qty 0.5

## 2018-10-04 MED ORDER — PREDNISONE 20 MG PO TABS
40.0000 mg | ORAL_TABLET | Freq: Every day | ORAL | Status: DC
  Administered 2018-10-05 – 2018-10-06 (×2): 40 mg via ORAL
  Filled 2018-10-04 (×3): qty 2

## 2018-10-04 MED ORDER — MIRTAZAPINE 15 MG PO TABS
7.5000 mg | ORAL_TABLET | Freq: Every day | ORAL | Status: DC
  Administered 2018-10-04 – 2018-10-05 (×2): 7.5 mg via ORAL
  Filled 2018-10-04 (×2): qty 1

## 2018-10-04 MED ORDER — HYDROCODONE-ACETAMINOPHEN 5-325 MG PO TABS
1.0000 | ORAL_TABLET | Freq: Four times a day (QID) | ORAL | Status: DC | PRN
  Administered 2018-10-05: 1 via ORAL
  Filled 2018-10-04 (×2): qty 1

## 2018-10-04 NOTE — Progress Notes (Signed)
[facility] Progress Note  Subjective: feeling a lot better today, diarrhea better and fevers resolving  Vitals:   10/03/18 1736 10/03/18 2100 10/03/18 2122 10/04/18 0606  BP: (!) 109/58  111/60 116/68  Pulse: 85 80 75 93  Resp: 18 20 20 18   Temp: 99.5 F (37.5 C)  98.9 F (37.2 C) 98 F (36.7 C)  TempSrc: Oral  Oral Oral  SpO2: 99%  97% 97%  Weight:    62.7 kg  Height:        Inpatient medications: . ALPRAZolam  0.5 mg Oral Daily  . buPROPion  100 mg Oral BID  . Chlorhexidine Gluconate Cloth  6 each Topical Daily  . enoxaparin (LOVENOX) injection  40 mg Subcutaneous Q24H  . hydrocortisone sod succinate (SOLU-CORTEF) inj  50 mg Intravenous Q8H  . lurasidone  20 mg Oral QHS  . sodium chloride flush  10-40 mL Intracatheter Q12H  . tamsulosin  0.4 mg Oral Daily  . vortioxetine HBr  20 mg Oral QHS   . ciprofloxacin 400 mg (10/04/18 1105)  . lactated ringers 100 mL/hr at 10/04/18 1400  . metronidazole 500 mg (10/04/18 0810)   acetaminophen, ondansetron (ZOFRAN) IV, sodium chloride flush  Exam: General: Pleasant resting in bed in no acute distress HEENT diffuse alopecia, mucous membranes moist Pulmonary: Clear to auscultation Abdomen: Soft nontender Cardiac: Regular rate and rhythm GU: Voids Extremities: Warm dry   Presentation Summary: Ricky Williams is a 23 y.o. male with medical history significant for chronic anxiety, testicular cancer status post 4 cycles of chemotherapy diagnosed in February 2019 at [facility] where he had his first cancer related surgery.  Last cycle of chemotherapy was 9 days ago at [facility].  Presented to [facility] ED due to persistent fever and chills with onset earlier this morning around 2 AM.  Patient is in the room accompanied by his mother who provides majority of the history due to the patient having shaking chills.  Around 2 AM vomited about 3 times.  His mother checked his temperature which was 100.5 and gave him sublingual Zofran.   Again around 930 started having shaking chills, mother checked his temperature and it was 100.7.  Due to persistent shaking chills and fever, his mother decided to call [facility] who recommended to bring him to the nearest ED for further evaluation and treatment.  ED Course: Upon presentation to the ED, patient is febrile with T-max of 103.1, hypotensive with map of 55 despite receiving boluses of IV fluid, tachycardiac with heart rate in the 120s and tachypneic with respiration rate in the mid 20s.  Chest x-ray unrevealing.  Urine analysis pending.  Blood cultures peripherally x2 in process.  TRH asked to admit.  Hospital Problems/ Course:  Septic shock 2/2 norovirus in immunocompromised host (h/o testicular cancer s/p recent chemo) - was in ICU on pressors briefly, dc'd to floor yesterday - diarrhea better, ^^WBC coming down - All other cultures (urine/ blood x 2) are negative, will dc antibiotics today - change to po prednisone 40 mg /d x 3 days then stop - can dc home tomorrow if continuing to improve   Fluid and electrolyte imbalance: hypokalemia ; Hyperchloremia ; NAG metabolic acidosis  Plan Taking solid foods today Taper down IVF's  Testicular cancer: currently undergoing VAC/IE at [facility].  Plan F/u at [facility]   Nausea/Vomiting Plan PRN rx   Anemia & Thrombocytopenia both likely secondary to chemotherapy, and now acute illness.  Plan Trend cbc    Anxiety &  Depression long complicated history of both Home xanax, wellbutrin, latuda, remeron, Trintellix resumed  Pulmonary nodule: RML identified on CTA 1/28 Plan Out pt f/u at [place] Status: full code Family Communication: none here Disposition Plan: dc tomorrow most likely Status: Inpatient   Kelly Splinter MD [facility] pgr [phone] 10/04/2018, 3:33 PM   Imaging:  CTA chest w/ contrast 1/28 > 1. No evidence of a pulmonary embolism. 2. Subtle area ground-glass opacity in the anteromedial  left upper lobe. This is most likely minor atelectasis. Infection is felt unlikely. 3. 6 mm nodule in the anteromedial right middle lobe. This is atelectasis or inflammatory. Recommend chest CT in 3 months to reassess this lesion for resolution. 4. No other abnormalities in the chest.  CXR 1/28 - no active disease Recent Labs  Lab 10/02/18 1427 10/03/18 0318 10/04/18 0423  NA 133* 137 140  K 3.7 3.1* 3.2*  CL 99 112* 109  CO2 22 21* 24  GLUCOSE 124* 111* 121*  BUN 22* 11 5*  CREATININE 0.96 0.70 0.67  CALCIUM 8.1* 6.9* 8.2*   Recent Labs  Lab 10/02/18 1427 10/03/18 0318  AST 36 29  ALT 39 34  ALKPHOS 75 46  BILITOT 0.7 0.3  PROT 6.8 5.0*  ALBUMIN 4.2 2.9*   Recent Labs  Lab 10/02/18 1427 10/03/18 0318 10/04/18 0423  WBC 29.6* 17.0* 19.0*  NEUTROABS 23.9* 11.3*  --   HGB 10.2* 7.7* 7.6*  HCT 33.0* 24.8* 24.5*  MCV 96.2 97.3 95.7  PLT 121* 86* 80*   Iron/TIBC/Ferritin/ %Sat No results found for: IRON, TIBC, FERRITIN, IRONPCTSAT

## 2018-10-04 NOTE — Progress Notes (Signed)
Pt said chest hurts when he takes a deep breath and keeps waking up sweaty. Will notify MD.

## 2018-10-04 NOTE — Plan of Care (Signed)
Plan of care reviewed and discussed with the patient and his parents.  Questions answered to their satisfaction.

## 2018-10-05 ENCOUNTER — Inpatient Hospital Stay (HOSPITAL_COMMUNITY): Payer: Managed Care, Other (non HMO)

## 2018-10-05 DIAGNOSIS — N451 Epididymitis: Secondary | ICD-10-CM | POA: Clinically undetermined

## 2018-10-05 DIAGNOSIS — C6292 Malignant neoplasm of left testis, unspecified whether descended or undescended: Secondary | ICD-10-CM

## 2018-10-05 LAB — CBC
HEMATOCRIT: 23.9 % — AB (ref 39.0–52.0)
Hemoglobin: 7.4 g/dL — ABNORMAL LOW (ref 13.0–17.0)
MCH: 29.2 pg (ref 26.0–34.0)
MCHC: 31 g/dL (ref 30.0–36.0)
MCV: 94.5 fL (ref 80.0–100.0)
Platelets: 86 10*3/uL — ABNORMAL LOW (ref 150–400)
RBC: 2.53 MIL/uL — ABNORMAL LOW (ref 4.22–5.81)
RDW: 17.2 % — ABNORMAL HIGH (ref 11.5–15.5)
WBC: 14.6 10*3/uL — ABNORMAL HIGH (ref 4.0–10.5)
nRBC: 0.3 % — ABNORMAL HIGH (ref 0.0–0.2)

## 2018-10-05 LAB — BASIC METABOLIC PANEL
Anion gap: 6 (ref 5–15)
BUN: 5 mg/dL — ABNORMAL LOW (ref 6–20)
CO2: 24 mmol/L (ref 22–32)
Calcium: 8 mg/dL — ABNORMAL LOW (ref 8.9–10.3)
Chloride: 110 mmol/L (ref 98–111)
Creatinine, Ser: 0.6 mg/dL — ABNORMAL LOW (ref 0.61–1.24)
GFR calc Af Amer: >60 mL/min (ref >60)
GFR calc non Af Amer: >60 mL/min (ref >60)
Glucose, Bld: 101 mg/dL — ABNORMAL HIGH (ref 70–99)
Potassium: 2.6 mmol/L — CL (ref 3.5–5.1)
Sodium: 140 mmol/L (ref 135–145)

## 2018-10-05 MED ORDER — CIPROFLOXACIN HCL 500 MG PO TABS
500.0000 mg | ORAL_TABLET | Freq: Two times a day (BID) | ORAL | Status: DC
  Administered 2018-10-05 – 2018-10-06 (×2): 500 mg via ORAL
  Filled 2018-10-05 (×2): qty 1

## 2018-10-05 MED ORDER — POTASSIUM CHLORIDE CRYS ER 20 MEQ PO TBCR
40.0000 meq | EXTENDED_RELEASE_TABLET | Freq: Two times a day (BID) | ORAL | Status: AC
  Administered 2018-10-05 (×2): 40 meq via ORAL
  Filled 2018-10-05 (×2): qty 2

## 2018-10-05 NOTE — Progress Notes (Signed)
CRITICAL VALUE ALERT  Critical Value: K+ 2.6  Date & Time Notied:  10/05/2018 @ 1188  Provider Notified: Arby Barrette, NP  Orders Received/Actions taken: awaiting response

## 2018-10-05 NOTE — Progress Notes (Signed)
PROGRESS NOTE    Ricky Williams  JFH:[id number] DOB: March 04, 1996 DOA: 10/02/2018 PCP: Gaynelle Arabian, MD   Brief Narrative:   Ricky Williams a 23 y.o.malewith medical history significant forchronic anxiety, testicular cancer status post4cyclesof chemotherapy diagnosed in February 2019 at [facility] where he had his first cancer related surgery.Last cycle of chemotherapy was 9 days ago at [facility]. Presented to [facility] ED due to persistent fever and chills with onset earlier this morning around 2 AM. Patient is in the room accompanied by his mother who provides majority of the history due to the patient having shaking chills. Around 2 AM vomited about 3 times. His mother checked his temperature which was 100.5 and gave himsublingual Zofran. Again around 930 started having shaking chills, mother checked his temperature and it was 100.7. Due to persistent shaking chills and fever, hismother decided to call [facility] who recommended to bring him to the nearest ED for further evaluation and treatment.  ED Course:Upon presentation to the ED,patient is febrile with T-max of 103.1, hypotensive with map of 55 despite receiving boluses of IV fluid,tachycardiac with heart rate in the 120s and tachypneic with respiration ratein the mid 20s. Chest x-ray unrevealing. Urine analysis pending. Blood cultures peripherallyx2 in process. TRH asked to admit.  Assessment & Plan:   Active Problems:   Sepsis ([place])   Septic shock ([place])   Gastroenteritis   Viral gastroenteritis   Immunosuppressed status ([place])   Immunosuppressed due to chemotherapy   Malignant neoplasm of testis ([place])   Septic shock, present on admission Acute gastroenteritis Norovirus in immunocompromised host Patient presented with persistent fever and chills found to be in septic shock.  GI PCR panel positive for norovirus.  Was initially in the intensive care unit on vasopressors and subsequently weaned off and  transferred to the floor on 10/04/2018.  Continues with diarrhea, although improving. --WBCs trending down, 29.6-->19.0-->14.6 --Urine culture showed no growth --Blood cultures x2: No growth x2 days --Broad-spectrum antibiotics discontinued on 10/04/2018 --Continues with steroid taper, now on prednisone 40 mg/day; continue for 3 days then DC --If diarrhea continues to be improved and no significant electrolyte imbalance, may be able to DC home tomorrow  Electrolyte disturbance Hypokalemia Continues with persistent diarrhea, although improved since admission.  Potassium 2.6 today. --Being replete with 80 mEq KCl p.o. --Repeat electrolytes in the a.m. to include magnesium  Acute right epididymitis Patient started complaining of right-sided testicular pain.  Denies any dysuria, or increased frequency.  Recent urinalysis with no growth.  Ultrasound of scrotum with Dopplers notable for acute right-sided epididymitis.  Given his immunocompromise state, will treat with antibiotics. --Ciprofloxacin 500 mg p.o. twice daily; continue for 10-day course  Testicular cancer Currently undergoing VAC/[facility] at [facility].  --Continue outpatient follow-up  Anemia Thrombocytopenia Etiology likely secondary to chemotherapy, and now acute illness.  --Stable, continue to monitor CBC  Anxiety/Depression long complicated history of both Home xanax, wellbutrin, latuda, remeron, Trintellix   Pulmonary nodule: 6 mm RML identified on CTA 1/28 --Recommend repeat CT chest in 3 months following resolution of acute illness  --further outpatient follow-up at [facility]   DVT prophylaxis: (Lovenox/Heparin/SCD's/anticoagulated/None (if comfort care) Code Status: Full code Family Communication: Mother at bedside Disposition Plan: Home in the next 1-2 days   Consultants:   None  Procedures:   None  Antimicrobials: (specify start and planned stop date. Auto populated tables are space  occupying and do not give end dates)  Cefepime, stopped 10/02/2018  Ciprofloxacin 400 mg  IV twice daily, stopped 10/04/2018  Metronidazole 500 mg IV every 8 hours stopped 10/04/2018  Zosyn stopped 10/02/2018   Subjective:  Patient seen and examined at bedside, mother present.  Continues with diarrhea; although improved since admission.  No fevers overnight.  Complains of right-sided scrotal pain/swelling onset yesterday evening.  Denies any trauma, history of left testicular cancer status post orchiectomy.  No other complaints at this time.  Denies headache, fever/chills/night sweats, no nausea/vomiting, no chest pain, no shortness of breath, no abdominal pain, no weakness, no fatigue, no paresthesias.  No acute events overnight per nursing staff.  Objective: Vitals:   10/05/18 0511 10/05/18 0517 10/05/18 1316 10/05/18 1603  BP: 124/76  120/76 105/72  Pulse: 88  73 72  Resp: 18  14 18   Temp: 99 F (37.2 C)  98.6 F (37 C) 98.4 F (36.9 C)  TempSrc: Oral  Oral Oral  SpO2: 94%  96% 100%  Weight:  62.5 kg    Height:        Intake/Output Summary (Last 24 hours) at 10/05/2018 1704 Last data filed at 10/05/2018 1400 Gross per 24 hour  Intake 2328.91 ml  Output -  Net 2328.91 ml   Filed Weights   10/03/18 0235 10/04/18 0606 10/05/18 0517  Weight: 66.4 kg 62.7 kg 62.5 kg    Examination:  General exam: Appears calm and comfortable,  Respiratory system: Clear to auscultation. Respiratory effort normal. Cardiovascular system: S1 & S2 heard, RRR. No JVD, murmurs, rubs, gallops or clicks. No pedal edema. Gastrointestinal system: Abdomen is nondistended, soft and nontender. No organomegaly or masses felt. Normal bowel sounds heard. GU -right scrotum slightly edematous, mild tenderness to palpation that is relieved with elevation Central nervous system: Alert and oriented. No focal neurological deficits. Extremities: Symmetric 5 x 5 power. Skin: No rashes, lesions or  ulcers Psychiatry: Judgement and insight appear normal. Mood & affect appropriate.     Data Reviewed: I have personally reviewed following labs and imaging studies  CBC: Recent Labs  Lab 10/02/18 1427 10/03/18 0318 10/04/18 0423 10/05/18 0330  WBC 29.6* 17.0* 19.0* 14.6*  NEUTROABS 23.9* 11.3*  --   --   HGB 10.2* 7.7* 7.6* 7.4*  HCT 33.0* 24.8* 24.5* 23.9*  MCV 96.2 97.3 95.7 94.5  PLT 121* 86* 80* 86*   Basic Metabolic Panel: Recent Labs  Lab 10/02/18 1427 10/03/18 0318 10/04/18 0423 10/05/18 0330  NA 133* 137 140 140  K 3.7 3.1* 3.2* 2.6*  CL 99 112* 109 110  CO2 22 21* 24 24  GLUCOSE 124* 111* 121* 101*  BUN 22* 11 5* 5*  CREATININE 0.96 0.70 0.67 0.60*  CALCIUM 8.1* 6.9* 8.2* 8.0*   GFR: Estimated Creatinine Clearance: 130.2 mL/min (A) (by C-G formula based on SCr of 0.6 mg/dL (L)). Liver Function Tests: Recent Labs  Lab 10/02/18 1427 10/03/18 0318  AST 36 29  ALT 39 34  ALKPHOS 75 46  BILITOT 0.7 0.3  PROT 6.8 5.0*  ALBUMIN 4.2 2.9*   No results for input(s): LIPASE, AMYLASE in the last 168 hours. No results for input(s): AMMONIA in the last 168 hours. Coagulation Profile: Recent Labs  Lab 10/02/18 1427  INR 1.11   Cardiac Enzymes: No results for input(s): CKTOTAL, CKMB, CKMBINDEX, TROPONINI in the last 168 hours. BNP (last 3 results) No results for input(s): PROBNP in the last 8760 hours. HbA1C: No results for input(s): HGBA1C in the last 72 hours. CBG: Recent Labs  Lab 10/03/18 0506  GLUCAP 103*  Lipid Profile: No results for input(s): CHOL, HDL, LDLCALC, TRIG, CHOLHDL, LDLDIRECT in the last 72 hours. Thyroid Function Tests: No results for input(s): TSH, T4TOTAL, FREET4, T3FREE, THYROIDAB in the last 72 hours. Anemia Panel: No results for input(s): VITAMINB12, FOLATE, FERRITIN, TIBC, IRON, RETICCTPCT in the last 72 hours. Sepsis Labs: Recent Labs  Lab 10/02/18 1450 10/02/18 1726 10/02/18 2300  LATICACIDVEN 2.1* 6.2* 0.8     Recent Results (from the past 240 hour(s))  Culture, blood (Routine x 2)     Status: None (Preliminary result)   Collection Time: 10/02/18  2:27 PM  Result Value Ref Range Status   Specimen Description   Final    BLOOD RIGHT ANTECUBITAL Performed at [facility] [address] [place], [place] [address]    Special Requests   Final    BOTTLES DRAWN AEROBIC AND ANAEROBIC Blood Culture adequate volume Performed at [facility] [address], [place], [place] [address]    Culture   Final    NO GROWTH 3 DAYS Performed at [facility], [place] [address] [place], [place] [address]    Report Status PENDING  Incomplete  Culture, blood (Routine x 2)     Status: None (Preliminary result)   Collection Time: 10/02/18  2:50 PM  Result Value Ref Range Status   Specimen Description   Final    BLOOD LEFT PORTA CATH Performed at [facility] [address], [place], [place] [address]    Special Requests   Final    BOTTLES DRAWN AEROBIC AND ANAEROBIC Blood Culture adequate volume Performed at [facility] [address], [place], [place] [address]    Culture   Final    NO GROWTH 3 DAYS Performed at [facility], [place] [address], [place], [place] [address]    Report Status PENDING  Incomplete  Urine Culture     Status: None   Collection Time: 10/02/18  5:26 PM  Result Value Ref Range Status   Specimen Description   Final    URINE, CLEAN CATCH Performed at [facility], [place] [address], [place], [place] [address]    Special Requests   Final    NONE Performed at [facility], [place] [address] [place], [place] [address]    Culture   Final    NO GROWTH Performed at [facility], [place] [address] [place],  [address]    Report Status 10/04/2018 FINAL  Final  Gastrointestinal Panel by PCR , Stool     Status: Abnormal   Collection Time: 10/02/18  9:16  PM  Result Value Ref Range Status   Campylobacter species NOT DETECTED NOT DETECTED Final   Plesimonas shigelloides NOT DETECTED NOT DETECTED Final   Salmonella species NOT DETECTED NOT DETECTED Final   Yersinia enterocolitica NOT DETECTED NOT DETECTED Final   Vibrio species NOT DETECTED NOT DETECTED Final   Vibrio cholerae NOT DETECTED NOT DETECTED Final   Enteroaggregative E coli (EAEC) NOT DETECTED NOT DETECTED Final   Enteropathogenic E coli (EPEC) NOT DETECTED NOT DETECTED Final   Enterotoxigenic E coli (ETEC) NOT DETECTED NOT DETECTED Final   Shiga like toxin producing E coli (STEC) NOT DETECTED NOT DETECTED Final   Shigella/Enteroinvasive E coli (EIEC) NOT DETECTED NOT DETECTED Final   Cryptosporidium NOT DETECTED NOT DETECTED Final   Cyclospora cayetanensis NOT DETECTED NOT DETECTED Final   Entamoeba histolytica NOT DETECTED NOT DETECTED Final  Giardia lamblia NOT DETECTED NOT DETECTED Final   Adenovirus F40/41 NOT DETECTED NOT DETECTED Final   Astrovirus NOT DETECTED NOT DETECTED Final   Norovirus GI/GII DETECTED (A) NOT DETECTED Final    Comment: RESULT CALLED TO, READ BACK BY AND VERIFIED WITH: CHRIS JONES @1125  ON 10/03/2018 BY FMW    Rotavirus A NOT DETECTED NOT DETECTED Final   Sapovirus (I, II, IV, and V) NOT DETECTED NOT DETECTED Final    Comment: Performed at [facility], [place] [place], [place] [address]  C difficile quick scan w PCR reflex     Status: None   Collection Time: 10/02/18  9:16 PM  Result Value Ref Range Status   C Diff antigen NEGATIVE NEGATIVE Final   C Diff toxin NEGATIVE NEGATIVE Final   C Diff interpretation No C. difficile detected.  Final    Comment: Performed at [facility], [place] [address] [place], [place] [address]  MRSA PCR Screening     Status: None   Collection Time: 10/03/18  2:29 AM  Result Value Ref Range Status   MRSA by PCR NEGATIVE NEGATIVE Final    Comment:        The GeneXpert MRSA Assay  (FDA approved for NASAL specimens only), is one component of a comprehensive MRSA colonization surveillance program. It is not intended to diagnose MRSA infection nor to guide or monitor treatment for MRSA infections. Performed at [facility], [place] [address] [place], [place] [address]          Radiology Studies: US Scrotum W/doppler  Result Date: 10/05/2018 CLINICAL DATA:  Scrotal pain. History of a left orchiectomy for testicular carcinoma EXAM: SCROTAL ULTRASOUND DOPPLER ULTRASOUND OF THE TESTICLES TECHNIQUE: Complete ultrasound examination of the testicles, epididymis, and other scrotal structures was performed. Color and spectral Doppler ultrasound were also utilized to evaluate blood flow to the testicles. COMPARISON:  None. FINDINGS: Right testicle Measurements: 4.5 x 2.7 x 3.1 cm. No masses. There are scattered small calcifications consistent with microlithiasis. Left testicle Surgically absent Right epididymis:  Enlarged, heterogeneous and hypervascular. Left epididymis:  Surgically absent Hydrocele:  Small to moderate hydrocele. Varicocele:  None visualized. Pulsed Doppler interrogation of the right testis demonstrates normal low resistance arterial and venous waveforms bilaterally. IMPRESSION: 1. Right epididymitis. 2. No testicular masses. Right testicular microlithiasis. Current literature suggests that testicular microlithiasis is not a significant independent risk factor for development of testicular carcinoma, and that follow up imaging is not warranted in the absence of other risk factors. Monthly testicular self-examination and annual physical exams are considered appropriate surveillance. If patient has other risk factors for testicular carcinoma, then referral to Urology should be considered. (Reference: DeCastro, et al.: A 5-Year Follow up Study of Asymptomatic Men with Testicular Microlithiasis. J Urol 2008; 127:5170-0174.) 3. Small to moderate right  hydrocele. Electronically Signed   By: Lajean Manes M.D.   On: 10/05/2018 15:09        Scheduled Meds: . ALPRAZolam  0.5 mg Oral Daily  . buPROPion  100 mg Oral BID  . Chlorhexidine Gluconate Cloth  6 each Topical Daily  . ciprofloxacin  500 mg Oral BID  . enoxaparin (LOVENOX) injection  40 mg Subcutaneous Q24H  . lurasidone  20 mg Oral QHS  . mirtazapine  7.5 mg Oral QHS  . potassium chloride  40 mEq Oral BID  . predniSONE  40 mg Oral Q breakfast  . sodium chloride flush  10-40 mL Intracatheter Q12H  .  tamsulosin  0.4 mg Oral Daily  . vortioxetine HBr  20 mg Oral QHS   Continuous Infusions: . lactated ringers 50 mL/hr at 10/05/18 1400     LOS: 3 days    Time spent: 32 minutes    Jamelle Noy J [place] DO [facility] Pager [phone]  If 7PM-7AM, please contact night-coverage [email or web address] Password [facility] 10/05/2018, 5:04 PM

## 2018-10-05 NOTE — Plan of Care (Signed)
Plan of care reviewed and discussed with the patient and his parents.  Questions answered.

## 2018-10-05 NOTE — Progress Notes (Signed)
Report called to [facility] on [place].

## 2018-10-05 NOTE — Progress Notes (Signed)
Orders received for Potassium replacement starting at 1000 today.

## 2018-10-06 DIAGNOSIS — N451 Epididymitis: Secondary | ICD-10-CM

## 2018-10-06 LAB — CBC
HCT: 23.5 % — ABNORMAL LOW (ref 39.0–52.0)
HEMOGLOBIN: 7.4 g/dL — AB (ref 13.0–17.0)
MCH: 30.3 pg (ref 26.0–34.0)
MCHC: 31.5 g/dL (ref 30.0–36.0)
MCV: 96.3 fL (ref 80.0–100.0)
Platelets: 101 10*3/uL — ABNORMAL LOW (ref 150–400)
RBC: 2.44 MIL/uL — ABNORMAL LOW (ref 4.22–5.81)
RDW: 17.2 % — ABNORMAL HIGH (ref 11.5–15.5)
WBC: 7.9 10*3/uL (ref 4.0–10.5)
nRBC: 0 % (ref 0.0–0.2)

## 2018-10-06 LAB — BASIC METABOLIC PANEL
Anion gap: 8 (ref 5–15)
BUN: 8 mg/dL (ref 6–20)
CO2: 24 mmol/L (ref 22–32)
Calcium: 8.6 mg/dL — ABNORMAL LOW (ref 8.9–10.3)
Chloride: 110 mmol/L (ref 98–111)
Creatinine, Ser: 0.58 mg/dL — ABNORMAL LOW (ref 0.61–1.24)
GFR calc non Af Amer: >60 mL/min (ref >60)
Glucose, Bld: 98 mg/dL (ref 70–99)
Potassium: 3.2 mmol/L — ABNORMAL LOW (ref 3.5–5.1)
SODIUM: 142 mmol/L (ref 135–145)

## 2018-10-06 LAB — MAGNESIUM: Magnesium: 2 mg/dL (ref 1.7–2.4)

## 2018-10-06 MED ORDER — HEPARIN SOD (PORK) LOCK FLUSH 100 UNIT/ML IV SOLN
500.0000 [IU] | Freq: Once | INTRAVENOUS | Status: DC
  Administered 2018-10-06: 500 [IU] via INTRAVENOUS
  Filled 2018-10-06: qty 5

## 2018-10-06 MED ORDER — POTASSIUM CHLORIDE CRYS ER 20 MEQ PO TBCR
40.0000 meq | EXTENDED_RELEASE_TABLET | ORAL | Status: DC
  Administered 2018-10-06: 40 meq via ORAL
  Filled 2018-10-06: qty 2

## 2018-10-06 MED ORDER — PREDNISONE 20 MG PO TABS: 20.0000 mg | ORAL_TABLET | Freq: Every day | ORAL | 0 refills | Status: AC

## 2018-10-06 MED ORDER — CIPROFLOXACIN HCL 500 MG PO TABS: 500.0000 mg | ORAL_TABLET | Freq: Two times a day (BID) | ORAL | 0 refills | Status: AC

## 2018-10-06 NOTE — Progress Notes (Signed)
Patient discharged to home with family, discharge instructions reviewed with patient and father who verbalized understanding. New RX's sent electronically to pharmacy.

## 2018-10-06 NOTE — Discharge Summary (Signed)
Physician Discharge Summary  Ricky Williams OYD:[id number] DOB: 1996-01-20 DOA: 10/02/2018  PCP: Gaynelle Arabian, MD  Admit date: 10/02/2018 Discharge date: 10/06/2018  Admitted From: Home  Discharge disposition: home   Recommendations for Outpatient Follow-Up:   F/u with pcp F/u with oncologist.  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  Diet recommendation: Regular.  Wound care: None.  Code status: Full.   History of Present Illness:   Ricky Williams a 23 y.o.malewith medical history significant forchronic anxiety, testicular cancer status post4cyclesof chemotherapy diagnosed in February 2019 at [facility] where he had his first cancer related surgery.Last cycle of chemotherapy was 9 days ago at [facility]. Presented to [facility] ED due to persistent fever and chills with onset earlier this morning around 2 AM. Patient is in the room accompanied by his mother who provides majority of the history due to the patient having shaking chills. Around 2 AM vomited about 3 times. His mother checked his temperature which was 100.5 and gave himsublingual Zofran. Again around 930 started having shaking chills, mother checked his temperature and it was 100.7. Due to persistent shaking chills and fever, hismother decided to call [facility] who recommended to bring him to the nearest ED for further evaluation and treatment.  ED Course:Upon presentation to the ED,patient is febrile with T-max of 103.1, hypotensive with map of 55 despite receiving boluses of IV fluid,tachycardiac with heart rate in the 120s and tachypneic with respiration ratein the mid 20s. Chest x-ray unrevealing. Urine analysis pending. Blood cultures were  sent.  Patient was initially admitted to ICU for septic shock and later transferred to floors on 10/04/2018.  Hospital Course:   Septic shock, present on admission Acute gastroenteritis Norovirus in immunocompromised host Patient presented with persistent fever and chills found to be in septic shock.  GI PCR panel positive for norovirus. He Was initially in the intensive care unit on vasopressors and subsequently weaned off and transferred to the floor on 10/04/2018.  Continues with diarrhea, although improving. --WBCs trending down, 29.6-->19.0-->14.6 --Urine culture showed no growth --Blood cultures x2: No growth x3 days --Broad-spectrum antibiotics discontinued on 10/04/2018 --Continues with steroid taper, now on prednisone 40 mg/day; continue with 20 mg daily for next 3 days and stop.  -- Diarrhea has significantly improved.  Electrolyte levels have been stabilized. --Patient would like to be discharged home today.  Electrolyte disturbance Hypokalemia Continues with persistent diarrhea, although improved since admission.  Potassium 2.6 yesterday, repleted with 80 mEq KCl p.o. repeat potassium today 3.2.  80 mEq KCl replacement ordered.  Magnesium level normal.  Acute right epididymitis Patient started complaining of right-sided testicular pain.  Denies any dysuria, or increased frequency.  Recent urinalysis with no growth.  Ultrasound of scrotum with Dopplers notable for acute right-sided epididymitis.  Given his immunocompromise state, patient was started on a course of ciprofloxacin 500 mg twice daily for a 10-day course.  Testicular cancer Currently undergoing VAC/IE at [facility].  --Continue outpatient follow-up  Anemia Thrombocytopenia Etiology likely secondary to chemotherapy, and now acute illness.  --Stable, continue to monitor CBC  Anxiety/Depression long complicated history of both Home xanax, wellbutrin, latuda, remeron, Trintellix  Pulmonary nodule: 6 mm RML  identified on CTA 1/28 --Recommend repeat CT chest in 3 months  following resolution of acute illness  --further outpatient follow-up at [facility]:    None.   Discharge Exam:   Vitals:   10/05/18 2242 10/06/18 0609  BP: 103/68 118/80  Pulse: 66 69  Resp: 18 16  Temp: 98.1 F (36.7 C) 97.9 F (36.6 C)  SpO2: 96% 95%   Vitals:   10/05/18 1603 10/05/18 2242 10/06/18 0500 10/06/18 0609  BP: 105/72 103/68  118/80  Pulse: 72 66  69  Resp: 18 18  16   Temp: 98.4 F (36.9 C) 98.1 F (36.7 C)  97.9 F (36.6 C)  TempSrc: Oral Oral  Oral  SpO2: 100% 96%  95%  Weight:   60.8 kg   Height:        General exam: Appears calm and comfortable. Father at bedside Respiratory system: Clear to auscultation. Respiratory effort normal. Cardiovascular system: S1 & S2 heard, RRR. No JVD,  rubs, gallops or clicks. No murmurs. Gastrointestinal system: Abdomen is nondistended, soft and nontender. No organomegaly or masses felt. Normal bowel sounds heard. GU system - left testicle absent, right testicle is swollen and slightly tender. Central nervous system: Alert and oriented. No focal neurological deficits. Extremities: No clubbing,  or cyanosis. No edema. Skin: No rashes, lesions or ulcers. Psychiatry: Judgement and insight appear normal. Mood & affect appropriate.   The results of significant diagnostics from this hospitalization (including imaging, microbiology, ancillary and laboratory) are listed below for reference.     Procedures and Diagnostic Studies:   Dg Chest 2 View  Result Date: 10/02/2018 CLINICAL DATA:  Fever. Receiving chemotherapy for testicular cancer. EXAM: CHEST - 2 VIEW COMPARISON:  02/22/2018 radiograph and prior studies FINDINGS: The cardiomediastinal silhouette is unremarkable. A LEFT Port-A-Cath is identified with tip overlying the SUPERIOR cavoatrial junction. There is no evidence of focal airspace disease, pulmonary edema,  suspicious pulmonary nodule/mass, pleural effusion, or pneumothorax. No acute bony abnormalities are identified. IMPRESSION: No active cardiopulmonary disease. Electronically Signed   By: Margarette Canada M.D.   On: 10/02/2018 14:47   Ct Angio Chest Pe W Or Wo Contrast  Result Date: 10/02/2018 CLINICAL DATA:  23 y.o. male with medical history significant for chronic anxiety, testicular cancer status post 4 cycles of chemotherapy diagnosed in February 2019 at [facility] where he had his first cancer related surgery. Last cycle of chemotherapy was 9 days ago at [facility]. Presented to [facility] ED due to persistent fever and chills with onset earlier this morning around 2 AM. EXAM: CT ANGIOGRAPHY CHEST WITH CONTRAST TECHNIQUE: Multidetector CT imaging of the chest was performed using the standard protocol during bolus administration of intravenous contrast. Multiplanar CT image reconstructions and MIPs were obtained to evaluate the vascular anatomy. CONTRAST:  135mL ISOVUE-370 IOPAMIDOL (ISOVUE-370) INJECTION 76% COMPARISON:  Current chest radiograph.  Chest CT, 11/09/2017. FINDINGS: Cardiovascular: There is satisfactory opacification the pulmonary arteries to the segmental level. There is no evidence of a pulmonary embolism. Heart is normal in size and configuration. No pericardial effusion. Great vessels are normal in caliber. Mediastinum/Nodes: No neck base, axillary, mediastinal or hilar masses or enlarged lymph nodes. Left anterior chest wall Port-A-Cath extends through the left subclavian vein, tip lying in the right atrium. Trachea and esophagus are unremarkable. Lungs/Pleura: Small nodule in the anterior inferior right middle lobe, with ill-defined margins, measuring 7 x 5 mm, mean 6 mm. This is new from the prior CT. It may reflect a small focus atelectasis. No other nodules. Subtle ground-glass opacity in  the anteromedial left upper lobe, likely minor atelectasis. Remainder of the lungs is clear. No pleural  effusion or pneumothorax. Upper Abdomen: No acute abnormality. Musculoskeletal: No chest wall abnormality. No acute or significant osseous findings. Review of the MIP images confirms the above findings. IMPRESSION: 1. No evidence of a pulmonary embolism. 2. Subtle area ground-glass opacity in the anteromedial left upper lobe. This is most likely minor atelectasis. Infection is felt unlikely. 3. 6 mm nodule in the anteromedial right middle lobe. This is atelectasis or inflammatory. Recommend chest CT in 3 months to reassess this lesion for resolution. 4. No other abnormalities in the chest. Electronically Signed   By: Lajean Manes M.D.   On: 10/02/2018 19:55     Labs:   Basic Metabolic Panel: Recent Labs  Lab 10/02/18 1427 10/03/18 0318 10/04/18 0423 10/05/18 0330 10/06/18 0618  NA 133* 137 140 140 142  K 3.7 3.1* 3.2* 2.6* 3.2*  CL 99 112* 109 110 110  CO2 22 21* 24 24 24   GLUCOSE 124* 111* 121* 101* 98  BUN 22* 11 5* 5* 8  CREATININE 0.96 0.70 0.67 0.60* 0.58*  CALCIUM 8.1* 6.9* 8.2* 8.0* 8.6*  MG  --   --   --   --  2.0   GFR Estimated Creatinine Clearance: 126.7 mL/min (A) (by C-G formula based on SCr of 0.58 mg/dL (L)). Liver Function Tests: Recent Labs  Lab 10/02/18 1427 10/03/18 0318  AST 36 29  ALT 39 34  ALKPHOS 75 46  BILITOT 0.7 0.3  PROT 6.8 5.0*  ALBUMIN 4.2 2.9*   No results for input(s): LIPASE, AMYLASE in the last 168 hours. No results for input(s): AMMONIA in the last 168 hours. Coagulation profile Recent Labs  Lab 10/02/18 1427  INR 1.11    CBC: Recent Labs  Lab 10/02/18 1427 10/03/18 0318 10/04/18 0423 10/05/18 0330 10/06/18 0618  WBC 29.6* 17.0* 19.0* 14.6* 7.9  NEUTROABS 23.9* 11.3*  --   --   --   HGB 10.2* 7.7* 7.6* 7.4* 7.4*  HCT 33.0* 24.8* 24.5* 23.9* 23.5*  MCV 96.2 97.3 95.7 94.5 96.3  PLT 121* 86* 80* 86* 101*   Cardiac Enzymes: No results for input(s): CKTOTAL, CKMB, CKMBINDEX, TROPONINI in the last 168 hours. BNP: Invalid  input(s): POCBNP CBG: Recent Labs  Lab 10/03/18 0506  GLUCAP 103*   D-Dimer No results for input(s): DDIMER in the last 72 hours. Hgb A1c No results for input(s): HGBA1C in the last 72 hours. Lipid Profile No results for input(s): CHOL, HDL, LDLCALC, TRIG, CHOLHDL, LDLDIRECT in the last 72 hours. Thyroid function studies No results for input(s): TSH, T4TOTAL, T3FREE, THYROIDAB in the last 72 hours.  Invalid input(s): FREET3 Anemia work up No results for input(s): VITAMINB12, FOLATE, FERRITIN, TIBC, IRON, RETICCTPCT in the last 72 hours. Microbiology Recent Results (from the past 240 hour(s))  Culture, blood (Routine x 2)     Status: None (Preliminary result)   Collection Time: 10/02/18  2:27 PM  Result Value Ref Range Status   Specimen Description BLOOD RIGHT ANTECUBITAL  Final   Special Requests   Final    BOTTLES DRAWN AEROBIC AND ANAEROBIC Blood Culture adequate volume Performed at [facility] [address], [place], [place] [address]    Culture NO GROWTH 4 DAYS  Final   Report Status PENDING  Incomplete  Culture, blood (Routine x 2)     Status: None (Preliminary result)   Collection Time: 10/02/18  2:50 PM  Result Value Ref Range Status   Specimen Description BLOOD LEFT PORTA CATH  Final   Special Requests   Final    BOTTLES DRAWN AEROBIC AND ANAEROBIC Blood Culture adequate volume Performed at [facility] [address] [place], [place] [address]    Culture NO GROWTH 4 DAYS  Final   Report Status PENDING  Incomplete  Urine Culture     Status: None   Collection Time: 10/02/18  5:26 PM  Result Value Ref Range Status   Specimen Description   Final    URINE, CLEAN CATCH Performed at [facility], [place] [address], [place], [place] [address]    Special Requests   Final    NONE Performed at [facility], [place] [address] [place], [place] [address]    Culture   Final    NO  GROWTH Performed at [facility], [place] [address] [place], [place] [address]    Report Status 10/04/2018 FINAL  Final  Gastrointestinal Panel by PCR , Stool     Status: Abnormal   Collection Time: 10/02/18  9:16 PM  Result Value Ref Range Status   Campylobacter species NOT DETECTED NOT DETECTED Final   Plesimonas shigelloides NOT DETECTED NOT DETECTED Final   Salmonella species NOT DETECTED NOT DETECTED Final   Yersinia enterocolitica NOT DETECTED NOT DETECTED Final   Vibrio species NOT DETECTED NOT DETECTED Final   Vibrio cholerae NOT DETECTED NOT DETECTED Final   Enteroaggregative E coli (EAEC) NOT DETECTED NOT DETECTED Final   Enteropathogenic E coli (EPEC) NOT DETECTED NOT DETECTED Final   Enterotoxigenic E coli (ETEC) NOT DETECTED NOT DETECTED Final   Shiga like toxin producing E coli (STEC) NOT DETECTED NOT DETECTED Final   Shigella/Enteroinvasive E coli (EIEC) NOT DETECTED NOT DETECTED Final   Cryptosporidium NOT DETECTED NOT DETECTED Final   Cyclospora cayetanensis NOT DETECTED NOT DETECTED Final   Entamoeba histolytica NOT DETECTED NOT DETECTED Final   Giardia lamblia NOT DETECTED NOT DETECTED Final   Adenovirus F40/41 NOT DETECTED NOT DETECTED Final   Astrovirus NOT DETECTED NOT DETECTED Final   Norovirus GI/GII DETECTED (A) NOT DETECTED Final    Comment: RESULT CALLED TO, READ BACK BY AND VERIFIED WITH: CHRIS JONES @1125  ON 10/03/2018 BY FMW    Rotavirus A NOT DETECTED NOT DETECTED Final   Sapovirus (I, II, IV, and V) NOT DETECTED NOT DETECTED Final    Comment: Performed at [facility], [address] [place], [place] [address]  C difficile quick scan w PCR reflex     Status: None   Collection Time: 10/02/18  9:16 PM  Result Value Ref Range Status   C Diff antigen NEGATIVE NEGATIVE Final   C Diff toxin NEGATIVE NEGATIVE Final   C Diff interpretation No C. difficile detected.  Final    Comment: Performed at [facility], [place]  [address], [place], [place] [address]  MRSA PCR Screening     Status: None   Collection Time: 10/03/18  2:29 AM  Result Value Ref Range Status   MRSA by PCR NEGATIVE NEGATIVE Final    Comment:        The GeneXpert MRSA Assay (FDA approved for NASAL specimens only), is one component of a comprehensive MRSA colonization surveillance program. It is not intended to diagnose MRSA infection nor to guide or monitor treatment for MRSA infections. Performed at [facility], [place] [address], [place], [place] [address]  Discharge Instructions:    Allergies as of 10/06/2018      Reactions   Adhesive [tape] Rash   Emend [aprepitant]       Medication List    TAKE these medications   acetaminophen 325 MG tablet Commonly known as:  TYLENOL Take 650 mg by mouth every 6 (six) hours as needed for mild pain or fever.   ALPRAZolam 0.5 MG 24 hr tablet Commonly known as:  XANAX XR Take 1 tablet (0.5 mg total) by mouth daily. What changed:    when to take this  reasons to take this   buPROPion 100 MG 12 hr tablet Commonly known as:  WELLBUTRIN SR TAKE 1 TABLET BY MOUTH TWICE A DAY   chlorhexidine 0.12 % solution Commonly known as:  PERIDEX 30 mLs by Mouth Rinse route daily.   ciprofloxacin 500 MG tablet Commonly known as:  CIPRO Take 1 tablet (500 mg total) by mouth 2 (two) times daily for 10 days.   dexamethasone 4 MG tablet Commonly known as:  DECADRON Take 4 mg by mouth daily. 3 days following chemo   lurasidone 20 MG Tabs tablet Commonly known as:  LATUDA Take 1 tablet (20 mg total) by mouth Nightly.   mirtazapine 7.5 MG tablet Commonly known as:  REMERON TAKE 1 TABLET (7.5 MG TOTAL) BY MOUTH AT BEDTIME.   multivitamin with minerals tablet Take 1 tablet by mouth daily.   NEULASTA 6 MG/0.6ML injection Generic drug:  pegfilgrastim   OLANZapine 5 MG tablet Commonly known as:  ZYPREXA Take 1-2 tablets (5-10 mg total) by mouth at bedtime. For  chemotherapy induced nausea. What changed:    when to take this  reasons to take this   ondansetron 8 MG tablet Commonly known as:  ZOFRAN Take 8 mg by mouth 3 (three) times daily as needed for nausea/vomiting.   PEG 3350 Powd Take 17 g by mouth daily as needed for constipation.   predniSONE 20 MG tablet Commonly known as:  DELTASONE Take 1 tablet (20 mg total) by mouth daily with breakfast for 3 days.   prochlorperazine 10 MG tablet Commonly known as:  COMPAZINE Take 1 tablet (10 mg total) by mouth once for 1 dose. What changed:    when to take this  reasons to take this   tamsulosin 0.4 MG Caps capsule Commonly known as:  FLOMAX Take 0.4 mg by mouth daily.   vortioxetine HBr 20 MG Tabs tablet Commonly known as:  TRINTELLIX Take 1 tablet (20 mg total) by mouth Nightly.       Time coordinating discharge: 39 minutes  Signed:  Keaghan Staton  [facility] 10/06/2018, 9:06 AM

## 2018-10-07 LAB — CULTURE, BLOOD (ROUTINE X 2)
CULTURE: NO GROWTH
Culture: NO GROWTH
SPECIAL REQUESTS: ADEQUATE
Special Requests: ADEQUATE

## 2018-10-12 LAB — STOOL CULTURE REFLEX - CMPCXR

## 2018-10-12 LAB — STOOL CULTURE: E coli, Shiga toxin Assay: NEGATIVE

## 2018-10-12 LAB — STOOL CULTURE REFLEX - RSASHR

## 2018-10-17 ENCOUNTER — Ambulatory Visit: Payer: 59 | Admitting: Psychology

## 2018-10-31 ENCOUNTER — Ambulatory Visit: Payer: 59 | Admitting: Psychology

## 2018-11-14 ENCOUNTER — Ambulatory Visit (INDEPENDENT_AMBULATORY_CARE_PROVIDER_SITE_OTHER): Payer: 59 | Admitting: Psychology

## 2018-11-14 ENCOUNTER — Other Ambulatory Visit: Payer: Self-pay

## 2018-11-14 DIAGNOSIS — F331 Major depressive disorder, recurrent, moderate: Secondary | ICD-10-CM

## 2018-11-28 ENCOUNTER — Ambulatory Visit: Payer: 59 | Admitting: Psychology

## 2018-11-29 ENCOUNTER — Telehealth: Payer: Self-pay | Admitting: Psychiatry

## 2018-11-29 NOTE — Telephone Encounter (Signed)
Mother, Lavella Hammock, calls to say that Caydon has been in cancer treatment for four months since surgery last fall, getting chemo.  Hospitals have tightened visitor restrictions. Mother would like a letter to allow her to be able to attend him if he does have to go to the hospital in the case of surgery or more cancer treatment at [facility] or if he happens to get sick and need to go to [facility] ER.

## 2018-11-29 NOTE — Telephone Encounter (Signed)
Phone review with mother regarding her requirement for letter of psychiatric necessity for her to be present with [facility] Curd for inpatient chemotherapeutic or surgical treatment in spite of coronavirus restrictions for safety and success in beginning and completing treatment is compiled and sent to mother by mail as she requires.

## 2018-12-12 ENCOUNTER — Ambulatory Visit (INDEPENDENT_AMBULATORY_CARE_PROVIDER_SITE_OTHER): Payer: 59 | Admitting: Psychology

## 2018-12-12 DIAGNOSIS — F331 Major depressive disorder, recurrent, moderate: Secondary | ICD-10-CM

## 2018-12-26 ENCOUNTER — Ambulatory Visit (INDEPENDENT_AMBULATORY_CARE_PROVIDER_SITE_OTHER): Payer: 59 | Admitting: Psychology

## 2018-12-26 DIAGNOSIS — F331 Major depressive disorder, recurrent, moderate: Secondary | ICD-10-CM | POA: Diagnosis not present

## 2018-12-31 ENCOUNTER — Encounter: Payer: Self-pay | Admitting: Psychiatry

## 2018-12-31 ENCOUNTER — Ambulatory Visit (INDEPENDENT_AMBULATORY_CARE_PROVIDER_SITE_OTHER): Payer: 59 | Admitting: Psychiatry

## 2018-12-31 ENCOUNTER — Other Ambulatory Visit: Payer: Self-pay

## 2018-12-31 DIAGNOSIS — F411 Generalized anxiety disorder: Secondary | ICD-10-CM

## 2018-12-31 DIAGNOSIS — F4010 Social phobia, unspecified: Secondary | ICD-10-CM | POA: Diagnosis not present

## 2018-12-31 DIAGNOSIS — F82 Specific developmental disorder of motor function: Secondary | ICD-10-CM

## 2018-12-31 DIAGNOSIS — F339 Major depressive disorder, recurrent, unspecified: Secondary | ICD-10-CM | POA: Diagnosis not present

## 2018-12-31 DIAGNOSIS — F900 Attention-deficit hyperactivity disorder, predominantly inattentive type: Secondary | ICD-10-CM

## 2018-12-31 DIAGNOSIS — F800 Phonological disorder: Secondary | ICD-10-CM

## 2018-12-31 MED ORDER — MIRTAZAPINE 7.5 MG PO TABS: 7.5000 mg | ORAL_TABLET | Freq: Every day | ORAL | 1 refills | Status: DC

## 2018-12-31 MED ORDER — BUPROPION HCL ER (SR) 100 MG PO TB12: 100.0000 mg | ORAL_TABLET | Freq: Two times a day (BID) | ORAL | 1 refills | Status: DC

## 2018-12-31 NOTE — Progress Notes (Signed)
[facility]  Patient ID: Ricky Williams,  MRN: [id number]  PCP: Gaynelle Arabian, MD  Date of Evaluation: 12/31/2018 Time spent:20 minutes from 1125 to 1145  I connected with patient by a video enabled telemedicine application or telephone, with their informed consent, and verified patient privacy and that I am speaking with the correct person using two identifiers.  I was located at [facility]  and patient conjointly with mother at home residence.  Chief Complaint:  Chief Complaint    Depression; Anxiety; ADHD      HISTORY/CURRENT STATUS: Ryshawn is provided telemedicine audiovisual appointment session, mother and patient preferring audio only due to high anxiety, with consent with epic collateral for psychiatric interview and exam in 66-month evaluation and management of treatment resistant depression, social anxiety, ADHD, generalized anxiety, and learning variances with cluster A traits.  Their availability and Naeem's capability for psychiatric appointment was considered uncertain at last session here. Now 1-1/2 months since completing chemo with tumors shrinking, they now consider radiation as surgery for persisting tumors would be vascularly very difficult.  Patient has been compliant and capable for all such surgical and medical treatments thus far, though as per my psychiatric letter for mother March 26, he has never safely and successfully completed such treatments without mother's presents as coronavirus pandemic national emergency may interrupt.  He continues his 4 medications not taking Xanax 0.5 mg ER for panic type anxiety as not needed currently.  Chemo and now radiation will be day treatments.  Mother notes that a PET scan is next required for the radiation treatment to follow.  Heith wishes he had something else to do like [employer] but notes that [facility] would not likely give him a scholarship despite his presence there. He has no current mania, psychosis,  suicidality, delirium, or dissociation. Depression       The patient presents with depression.  This is a recurrent problem.  The current episode started more than 1 year ago.   The onset quality is gradual.   The problem occurs constantly.  The problem has been gradually improving since onset.  Associated symptoms include decreased concentration, fatigue, helplessness, hopelessness and sad.  Associated symptoms include not irritable, no decreased interest, no headaches, no indigestion and no suicidal ideas.     The symptoms are aggravated by medication, social issues and family issues.  Past treatments include SNRIs - Serotonin and norepinephrine reuptake inhibitors, TCAs - Tricyclic antidepressants, SSRIs - Selective serotonin reuptake inhibitors, psychotherapy and other medications.  Compliance with treatment is good.  Past compliance problems include difficulty with treatment plan, medication issues and medical issues.  Previous treatment provided mild relief.  Risk factors include a recent illness, family history, family history of mental illness, history of mental illness and major life event.   Past medical history includes recent illness, life-threatening condition, physical disability, anxiety, depression and mental health disorder.     Pertinent negatives include no recent psychiatric admission, no bipolar disorder, no eating disorder, no obsessive-compulsive disorder, no post-traumatic stress disorder, no schizophrenia, no suicide attempts and no head trauma.   Individual Medical History/ Review of Systems: Changes? :Yes  , leg pain is new and concerning for origin and relief.  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  Allergies: Adhesive [tape] and Emend [aprepitant]  Current Medications:  Current Outpatient Medications:  .  acetaminophen (TYLENOL) 325 MG tablet, Take 650 mg by mouth every 6 (six) hours  as needed  for mild pain or fever. , Disp: , Rfl:  .  ALPRAZolam (XANAX XR) 0.5 MG 24 hr tablet, Take 1 tablet (0.5 mg total) by mouth daily. (Patient taking differently: Take 0.5 mg by mouth daily as needed for anxiety. ), Disp: 30 tablet, Rfl: 5 .  buPROPion (WELLBUTRIN SR) 100 MG 12 hr tablet, Take 1 tablet (100 mg total) by mouth 2 (two) times daily., Disp: 180 tablet, Rfl: 1 .  chlorhexidine (PERIDEX) 0.12 % solution, 30 mLs by Mouth Rinse route daily., Disp: , Rfl:  .  dexamethasone (DECADRON) 4 MG tablet, Take 4 mg by mouth daily. 3 days following chemo, Disp: , Rfl: 0 .  lurasidone (LATUDA) 20 MG TABS tablet, Take 1 tablet (20 mg total) by mouth Nightly., Disp: 30 tablet, Rfl: 11 .  mirtazapine (REMERON) 7.5 MG tablet, Take 1 tablet (7.5 mg total) by mouth at bedtime., Disp: 90 tablet, Rfl: 1 .  Multiple Vitamins-Minerals (MULTIVITAMIN WITH MINERALS) tablet, Take 1 tablet by mouth daily., Disp: , Rfl:  .  NEULASTA 6 MG/0.6ML injection, , Disp: , Rfl:  .  OLANZapine (ZYPREXA) 5 MG tablet, Take 1-2 tablets (5-10 mg total) by mouth at bedtime. For chemotherapy induced nausea. (Patient taking differently: Take 5-10 mg by mouth daily as needed (nausea). For chemotherapy induced nausea.), Disp: 40 tablet, Rfl: 1 .  ondansetron (ZOFRAN) 8 MG tablet, Take 8 mg by mouth 3 (three) times daily as needed for nausea/vomiting., Disp: , Rfl:  .  Polyethylene Glycol 3350 (PEG 3350) POWD, Take 17 g by mouth daily as needed for constipation., Disp: , Rfl:  .  prochlorperazine (COMPAZINE) 10 MG tablet, Take 1 tablet (10 mg total) by mouth once for 1 dose. (Patient taking differently: Take 10 mg by mouth daily as needed for nausea. ), Disp: 30 tablet, Rfl: 0 .  tamsulosin (FLOMAX) 0.4 MG CAPS capsule, Take 0.4 mg by mouth daily., Disp: , Rfl:  .  vortioxetine HBr (TRINTELLIX) 20 MG TABS tablet, Take 1 tablet (20 mg total) by mouth Nightly., Disp: 30 tablet, Rfl: 11   Medication Side Effects: none   Family Medical/ Social  History: Changes? No  MENTAL HEALTH EXAM:  There were no vitals taken for this visit.There is no height or weight on file to calculate BMI.  General Appearance: N/A  Eye Contact:  N/A  Speech:  Blocked, Clear and Coherent and Slow  Volume:  Decreased  Mood:  Anxious, Depressed, Dysphoric and Worthless  Affect:  Depressed, Inappropriate and Anxious  Thought Process:  Goal Directed and Linear  Orientation:  Full (Time, Place, and Person)  Thought Content: Ilusions, Obsessions, Paranoid Ideation and Rumination   Suicidal Thoughts:  No  Homicidal Thoughts:  No  Memory:  Immediate;   Good Remote;   Good  Judgement:  Fair  Insight:  Fair and Lacking  Psychomotor Activity:  Decreased, Mannerisms and Psychomotor Retardation  Concentration:  Concentration: Fair and Attention Span: Fair  Recall:  [employer] of Knowledge: Fair  Language: Fair  Assets:  Desire for Improvement Talents/Skills Transportation  ADL's:  Intact  Cognition: WNL  Prognosis:  Poor    DIAGNOSES:    ICD-10-CM   1. Social anxiety disorder F40.10 mirtazapine (REMERON) 7.5 MG tablet  2. 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 Generalized anxiety disorder F41.1 buPROPion (WELLBUTRIN SR) 100 MG 12 hr tablet  mirtazapine (REMERON) 7.5 MG tablet  4. ADHD, predominantly inattentive type F90.0 buPROPion (WELLBUTRIN SR) 100 MG 12 hr tablet  5. 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  He is E scribed Remeron 7.5 mg every bedtime #90 with 1 refill to [facility] [address] in [place] for social and generalized anxiety and major depression.  Wellbutrin 100 mg SR twice daily is E scribed #180 with 1 refill [facility] in [place] for ADHD and depression.  He continues current refills of 6 months remaining for Latuda 20 mg nightly with food and Trintellix 20 mg nightly for treatment resistant depression and anxiety.  He has Xanax if needed 0.5 mg ER daily as needed for panic.  They discuss return in 6 months.  Virtual Visit via Video Note  I connected with KEYMARI SATO on 12/31/18 at 11:20 AM EDT by a video enabled telemedicine application and verified that I am speaking with the correct person using two identifiers.   I discussed the limitations of evaluation and management by telemedicine and the availability of in person appointments. The patient expressed understanding and agreed to proceed.  History of Present Illness: 23-month evaluation and management of treatment resistant depression, social anxiety, ADHD, generalized anxiety, and learning variances with cluster A traits.  Their availability and Kie's capability for psychiatric appointment was considered uncertain at last session here. Now 1-1/2 months since completing chemo with tumors shrinking, they now consider radiation as surgery for persisting tumors would be vascularly very difficult.  Patient has been compliant and capable for all such surgical and medical treatments thus far, though as per my psychiatric letter for mother March 26, he has never safely and successfully completed such treatments without mother's presents as coronavirus pandemic national emergency may interrupt.    Observations/Objective: Mood:  Anxious, Depressed, Dysphoric and Worthless  Affect:  Depressed, Inappropriate and Anxious  Thought Process:  Goal Directed and Linear  Orientation:  Full (Time, Place, and Person)  Thought Content: Ilusions, Obsessions, Paranoid Ideation and Rumination    Assessment and Plan: He is E scribed Remeron 7.5 mg every bedtime #90 with 1 [facility] [address] in [place] for social and generalized  anxiety and major depression.  Wellbutrin 100 mg SR twice daily is E scribed #180 with 1 [facility] [place] for ADHD and depression.  He continues current refills of 6 months remaining for Latuda 20 mg nightly with food and Trintellix 20 mg nightly for treatment resistant depression and anxiety.  He has Xanax if needed 0.5 mg ER daily as needed for panic.  Follow Up Instructions:  They discuss return in 6 months.    I discussed the assessment and treatment plan with the patient. The patient was provided an opportunity to ask questions and all were answered. The patient agreed with the plan and demonstrated an understanding of the instructions.   The patient was advised to call back or seek an in-person evaluation if the symptoms worsen or if the condition fails to improve as anticipated.  I provided 20 minutes of non-face-to-face time during this encounter. [employer] WebEx meeting #[id number] Password: [email or web address]  Delight Hoh, MD  Delight Hoh, MD

## 2019-01-09 ENCOUNTER — Ambulatory Visit
Admission: RE | Admit: 2019-01-09 | Discharge: 2019-01-09 | Disposition: A | Discharge status: Home / Self Care | Payer: Self-pay | Source: Ambulatory Visit | Attending: Radiation Oncology | Admitting: Radiation Oncology

## 2019-01-09 ENCOUNTER — Ambulatory Visit: Payer: 59 | Admitting: Psychology

## 2019-01-09 ENCOUNTER — Ambulatory Visit: Payer: Managed Care, Other (non HMO) | Admitting: Radiation Oncology

## 2019-01-09 ENCOUNTER — Ambulatory Visit: Payer: Managed Care, Other (non HMO)

## 2019-01-09 ENCOUNTER — Other Ambulatory Visit: Payer: Self-pay | Admitting: Radiation Oncology

## 2019-01-09 ENCOUNTER — Encounter: Payer: Self-pay | Admitting: Radiation Oncology

## 2019-01-09 DIAGNOSIS — D489 Neoplasm of uncertain behavior, unspecified: Secondary | ICD-10-CM

## 2019-01-09 NOTE — Progress Notes (Signed)
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 He has a left pelvic mass causing hip and leg pain and left hydronephroureterosis. On PET he has a right pelvic LN but no other metastatic disease.   Ricky Williams presented in early February 2019 with left testicular pain and swelling. An ultrasound revealed a 4.8 cm testicular mass. Left radical orchiectomy performed 11/01/2017 followed by a lymph node dissection done 05/24/2018.  PET done 01/03/2019 displayed residual uptake left pelvic& contiguous acetabular mass and right iliac lymph node  A. Paracaval lymph node, excision:  Thirteen lymph node profiles, negative for tumor (0/13).   B. Intra-aorta caval lymph node, excision:  Six lymph nodes, negative for tumor (0/6).   C. Left spermatic cord, excision:  Segment of benign spermatic cord with associated fibroadipose tissue. Suture granulomas present. Negative for tumor.   D. Para-aortic lymph nodes, excision:  Thirty lymph node profiles, negative for tumor (0/30).   E. Retroperitoneal para-aortic mass, resection:  1. 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 2. Molecular testing in process, addendum to follow with final diagnosis. 3. See Diagnostic Comment. 4. Seven additional uninvolved lymph node profiles, negative for tumor (0/7).   F. 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 Negative for high-grade immaturity, negative for non-teratomatous component.   G. Left common iliac lymph node, excision:  Eighteen lymph node profiles, negative for tumor (0/18).   H. Left external iliac lymph node, excision:  Three lymph nodes, negative for tumor (0/3).   I. Left obturator mass, resection:  1. High grade malignancy, 7.0 cm, favor primitive neuroectodermal tumor (Ewings/PNET), in  fibroadipose tissue, extending to edge of resection. 2. See Diagnostic Comment.   J. Left internal iliac lymph node, excision:  1. Metastatic high grade malignancy, largest 1.2 cm, favor primitive neuroectodermal tumor (Ewings/PNET), in two of ten lymph nodes (2/10), and in adjacent fibroadipose tissue. 2. See Diagnostic Comment.   K. Left external iliac vein, excision:  High grade malignancy, largest 1.2 cm, favor primitive neuroectodermal tumor (Ewings/PNET), in vein wall.   L. Left inguinal scrotal mass, resection:  High grade malignancy, 4.5 cm, favor primitive neuroectodermal tumor (Ewings/PNET), in fibroadipose tissue. See Diagnostic Comment.  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 AFP 255.4; bHCG 19; LDH 62 -12/07/17 AFP 5; bHCG <1; LDH 122 -01/15/18 AFP 0.9; bHCG <1; LDH 145 -01/30/18: Chemotherapy BEPx1 -02/08/18 AFP 1.0; bHCG <1; LDH 172 -04/16/18: CT pelvis 3.1 x 3.0 cm left pelvic sidewall lymph node, 2.3 x 2.5 cm necrotic soft tissue lesion left inguinal canal  -04/17/18 AFP 1.2; bHCG <1; LDH 176 -04/25/18: CT cap. Para-aortic node (1.8 cm)/ Left obturator node (3.8 cm). Left inguinoscrotal mass (3.1 cm) -05/02/18: initial consult at [facility] -05/14/18 Surgery Dr Dimas Millin. RPLND + PLND + left inguinoscrotal mass excision + resection left external iliac vein. Retroperitoneal nodes: 64 negative nodes, 1 node teratoma (5 mm), 1 node PNET/Ewing's (2.8 cm). Pelvic nodes: 29 negative nodes, 3 nodes PNET/Ewing's (7.0  cm, 1.2 cm,  1.2 cm). External iliac vein: invaded by PNET/Ewing's (1.2 cm). Left scrotal mass: PNET/Ewing's (4.5 cm) -07/02/18 AFP 0.9; bHCG <0.6; CT cap showing mild left hydroureteronephrosis, new 12mm RML pulmonary nodule, and left pelvic sidewall masses with central necrosis -07/18/18: C1D1 of Doxorubicin + Cyclophosphamide + Mesna given every three weeks alternating with Ifosfamide + Etoposide + Mesna.  - 08/30/18 C3 D1 CAV (including vincristine) -09/18/18 CT CAP - evidence of minor response in nodes, non-specific bone changes.  -09/19/18 C4 D1 Ifosfamide etoposide chemo. -10/31/18 C6 D1 Ifos/ etop Chemo - 11/26/18 CT/BS - mixed response in nodes, increased sclerosis in bones, no new lesions -01/03/19 PET/CT - FDG uptake in left pelvic nodes and right iliac node with measurable size increase; left acetabulum uptake    Weight changes, if any: 139 lb 2-3 weeks ago. Down to 135 lb now. Mother relates this decline to pain and constipation.  Bowel/Bladder complaints, if any: Stool softener & Miralax daily to manage constipation. Occasionally requires Senokot. Bloody drainage seen via WebEx in nephrostomy collection bag without clots. Reports he continues to pass urine out of penis without complaints of dysuria, hematuria, or leakage. Denies nocturia.   Nausea/Vomiting, if any: no  Pain issues, if any:  Reports constant abdominal, left hip and left leg pain 4 on a scale of 0-10.   SAFETY ISSUES:  Prior radiation? no  Pacemaker/ICD? no  Possible current pregnancy? no, male patient  Is the patient on methotrexate? no  Current Complaints / other details:  23 year old. Single. Resides in [place]. Wanting radiation therapy closer to home than [facility].  Patient and mother at [facility] awaiting result of doppler ultrasound to rule out clot in left leg.

## 2019-01-10 ENCOUNTER — Ambulatory Visit
Admission: RE | Admit: 2019-01-10 | Discharge: 2019-01-10 | Disposition: A | Discharge status: Home / Self Care | Payer: Managed Care, Other (non HMO) | Source: Ambulatory Visit | Attending: Radiation Oncology | Admitting: Radiation Oncology

## 2019-01-10 ENCOUNTER — Other Ambulatory Visit: Payer: Self-pay

## 2019-01-10 ENCOUNTER — Encounter: Payer: Self-pay | Admitting: Radiation Oncology

## 2019-01-10 DIAGNOSIS — C7951 Secondary malignant neoplasm of bone: Secondary | ICD-10-CM | POA: Insufficient documentation

## 2019-01-10 DIAGNOSIS — C775 Secondary and unspecified malignant neoplasm of intrapelvic lymph nodes: Secondary | ICD-10-CM

## 2019-01-10 DIAGNOSIS — C779 Secondary and unspecified malignant neoplasm of lymph node, unspecified: Secondary | ICD-10-CM | POA: Insufficient documentation

## 2019-01-10 DIAGNOSIS — C7952 Secondary malignant neoplasm of bone marrow: Principal | ICD-10-CM

## 2019-01-10 HISTORY — DX: Malignant neoplasm of unspecified testis, unspecified whether descended or undescended: C62.90

## 2019-01-10 NOTE — Progress Notes (Signed)
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    ICD-10-CM   1. 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 male seen at the request of Dr. Dwana Curd.  He initially presented to Dr. Lovena Neighbours at [facility] urology in February 2019 with left testicular pain and swelling.  Ultrasound evaluation at that time revealed a 4.8 cm testicular mass.  The patient underwent left radical orchiectomy on 11/01/2017 with final pathology confirming pT2Nx, NSGCT (70% embryonal, 30% yolk sac).  A staging CT A/P on 11/09/2017 was negative as were biomarkers.  The patient completed a single cycle of adjuvant BEP under the care and direction of Dr. Alen Blew on 01/30/2018.  Restaging CT A/P on 02/22/2018 was without evidence of metastatic disease. He developed a painless left inguinal mass in August 2019 which appeared as a solid, complex heterogeneous mass in the left inguinal canal measuring up to 2.9 cm on pelvic ultrasound 04/16/2018.  This was further evaluated with CT chest abdomen and pelvis on 04/26/2018 which confirmed a 2.7 cm enhancing mass within the left inguinal region, suspicious for local recurrence as well as pelvic lymphadenopathy most consistent with nodal metastases with a 3.8 cm left external iliac node and 1.8 cm left periaortic node.  There was no  evidence of metastatic disease in the chest.  The patient was referred for a second opinion at [facility] with Dr. Jaquelyn Bitter and underwent a retroperitoneal lymph node dissection with resection of the inguinal scrotal mass on 05/24/2018.  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  A repeat CT scan on 07/02/2018 showed residual enhancing lymph nodes in the pelvis and a left acetabular mass.  At that point, the patient was referred to Dr. Lawanna Kobus in medical oncology at [facility].  The patient completed 6 cycles of AC alternating with ifosfamide/etoposide and [place] between 07/18/2018 and 10/31/2018.  Repeat systemic imaging on 11/26/2018 showed a mixed nodal response and increased bone sclerosis in the pelvis.  More recently, he had a PET scan on 01/03/2019 which confirmed disease progression with increased left greater than right pelvic lymphadenopathy and similar sclerosis of the right ischial and left iliac bone/acetabular sclerotic lesions with increased uptake consistent with active osseous metastases.  There was also noted interval new left hydroureteronephrosis likely secondary to high-grade obstruction from the left pelvic sidewall lymphadenopathy.  He had a percutaneous nephrostomy tube placed by interventional radiology on 01/09/2019.  He reports that the nephrostomy tube is continued to drain appropriately.  He has continued with progressive pain in the left hip/leg which is worse at night and keeps him from being able to sleep.  He developed significant, painful swelling in the left thigh extending to his left ankle on  01/09/2019 which has persisted.  He is currently at [facility] for venous Doppler ultrasound to rule out acute acute DVT.  He also has restless pain in the right lower extremity, to a lesser degree than that on the left  and without edema.  He denies paresthesias or focal weakness in the lower extremities.  He is currently taking oxycodone 5 mg several times a week but not on a daily basis as this medication causes abdominal pain and constipation.  He recently met with Dr. Eduard Roux in radiation oncology at [facility] on 01/07/2019 to discuss the potential role of palliative radiotherapy.  The recommendation was to proceed with a 10 fraction course of daily radiotherapy to all sites of active disease on the PET scan which the patient is interested in proceeding with but would like to have locally in [place] since this is closer to his home in [place].  The hope is that this will help to alleviate/control his pain and possibly slow symptomatic progression to improve his overall quality of life.  The patient was kindly referred today to discuss palliative radiotherapy here locally.  PREVIOUS RADIATION THERAPY: No  PAST MEDICAL HISTORY:  Past Medical History:  Diagnosis Date   Eczema    GAD (generalized anxiety disorder)    Major depression    total 5 electroculsive treatments last one 10-18-2017   Mass of left testicle    S/P ECT (electroconvulsive therapy) x5 treatments  last one 10-18-2017   Testicular cancer [facility])    Wears contact lenses       PAST SURGICAL HISTORY: Past Surgical History:  Procedure Laterality Date   ORCHIECTOMY Left 11/01/2017   Procedure: LEFT ORCHIECTOMY;  Surgeon: Ceasar Mons, MD;  Location: [facility];  Service: Urology;  Laterality: Left;    FAMILY HISTORY:  Family History  Problem Relation Age of Onset   Breast cancer Mother    Prostate cancer Father    Kidney Stones Father    Arthritis Father        Psoriatic    Multiple myeloma Maternal Grandmother    Melanoma Maternal Grandfather    Breast cancer Paternal Grandmother    Healthy Paternal Grandfather     SOCIAL HISTORY:  Social History   Socioeconomic History    Marital status: Single    Spouse name: Not on file   Number of children: 0   Years of education: 12   Highest education level: High school graduate  Occupational History   Occupation: Ship broker    Comment: [employer] thinking about Teacher, early years/pre strain: Not hard at all   Food insecurity:    Worry: Never true    Inability: Never true   Transportation needs:    Medical: No    Non-medical: No  Tobacco Use   Smoking status: Never Smoker   Smokeless tobacco: Never Used  Substance and Sexual Activity   Alcohol use: No   Drug use: No   Sexual activity: Never  Lifestyle   Physical activity:    Days per week: 0 days    Minutes per session: Not on file   Stress: Not at all  Relationships   Social connections:    Talks on phone: More than three times a week    Gets together: More than three times a week    Attends religious service: Never    Active member of club or organization: No    Attends meetings of clubs or  organizations: Never    Relationship status: Never married   Intimate partner violence:    Fear of current or ex partner: No    Emotionally abused: No    Physically abused: No    Forced sexual activity: No  Other Topics Concern   Not on file  Social History Narrative   Fun/Hobby: Video games     ALLERGIES: Adhesive [tape] and Emend [aprepitant]  MEDICATIONS:  Current Outpatient Medications  Medication Sig Dispense Refill   acetaminophen (TYLENOL) 325 MG tablet Take 650 mg by mouth every 6 (six) hours as needed for mild pain or fever.      ALPRAZolam (XANAX XR) 0.5 MG 24 hr tablet Take 1 tablet (0.5 mg total) by mouth daily. (Patient taking differently: Take 0.5 mg by mouth daily as needed for anxiety. ) 30 tablet 5   buPROPion (WELLBUTRIN SR) 100 MG 12 hr tablet Take 1 tablet (100 mg total) by mouth 2 (two) times daily. 180 tablet 1   chlorhexidine (PERIDEX) 0.12 % solution 30 mLs by Mouth Rinse route daily.      lurasidone (LATUDA) 20 MG TABS tablet Take 1 tablet (20 mg total) by mouth Nightly. 30 tablet 11   mirtazapine (REMERON) 7.5 MG tablet Take 1 tablet (7.5 mg total) by mouth at bedtime. 90 tablet 1   Multiple Vitamins-Minerals (MULTIVITAMIN WITH MINERALS) tablet Take 1 tablet by mouth daily.     NEULASTA 6 MG/0.6ML injection      oxyCODONE (OXY IR/ROXICODONE) 5 MG immediate release tablet TAKE 1 2 TABLETS BY MOUTH EVERY 4 HOURS AS NEEDED FOR PAIN(1 TAB   MODERATE PAIN 2 TAB  SEVERE PAIN)     Sodium Chloride Flush (NORMAL SALINE FLUSH) 0.9 % SOLN      vortioxetine HBr (TRINTELLIX) 20 MG TABS tablet Take 1 tablet (20 mg total) by mouth Nightly. 30 tablet 11   dexamethasone (DECADRON) 4 MG tablet Take 4 mg by mouth daily. 3 days following chemo  0   OLANZapine (ZYPREXA) 5 MG tablet Take 1-2 tablets (5-10 mg total) by mouth at bedtime. For chemotherapy induced nausea. (Patient not taking: Reported on 01/10/2019) 40 tablet 1   ondansetron (ZOFRAN) 8 MG tablet Take 8 mg by mouth 3 (three) times daily as needed for nausea/vomiting.     Polyethylene Glycol 3350 (PEG 3350) POWD Take 17 g by mouth daily as needed for constipation.     predniSONE (DELTASONE) 10 MG tablet TAKE 2 TABLETS BY MOUTH EVERY DAY WITH BREAKFAST FOR 3 DAYS     prochlorperazine (COMPAZINE) 10 MG tablet Take 1 tablet (10 mg total) by mouth once for 1 dose. (Patient taking differently: Take 10 mg by mouth daily as needed for nausea. ) 30 tablet 0   No current facility-administered medications for this encounter.     REVIEW OF SYSTEMS:  On review of systems, the patient reports that he is doing well overall.  He denies any chest pain, shortness of breath, cough, fevers, chills, night sweats, or unintended weight changes. He denies any bowel or bladder incontinence.  He does have occasional abdominal pain and constipation associated with taking narcotic pain medications.  He denies nausea, vomiting or diarrhea.  He denies any new  musculoskeletal or joint aches or pains aside from that discussed above in the HPI related to his pelvic and left hip pain. A complete review of systems is obtained and is otherwise negative.  PHYSICAL EXAM:  Wt Readings from Last 3 Encounters:  01/10/19 135 lb (61.2  kg)  10/06/18 134 lb 0.6 oz (60.8 kg)  04/17/18 131 lb 6.4 oz (59.6 kg)   Temp Readings from Last 3 Encounters:  10/06/18 97.9 F (36.6 C) (Oral)  04/17/18 97.9 F (36.6 C) (Oral)  02/08/18 97.7 F (36.5 C) (Oral)   BP Readings from Last 3 Encounters:  10/06/18 118/80  04/17/18 99/66  02/08/18 96/63   Pulse Readings from Last 3 Encounters:  10/06/18 69  04/17/18 85  02/08/18 78   Pain Assessment Pain Score: 4  Pain Frequency: Constant Pain Loc: Abdomen(left leg, stomach, hip)/10  In general this is a well appearing Caucasian male in no acute distress.  He is alert and oriented x4 and appropriate throughout the encounter. Cardiopulmonary assessment is negative for acute distress and he exhibits normal effort.   KPS = 70  100 - Normal; no complaints; no evidence of disease. 90   - Able to carry on normal activity; minor signs or symptoms of disease. 80   - Normal activity with effort; some signs or symptoms of disease. 70   - Cares for self; unable to carry on normal activity or to do active work. 60   - Requires occasional assistance, but is able to care for most of his personal needs. 50   - Requires considerable assistance and frequent medical care. 22   - Disabled; requires special care and assistance. 68   - Severely disabled; hospital admission is indicated although death not imminent. 41   - Very sick; hospital admission necessary; active supportive treatment necessary. 10   - Moribund; fatal processes progressing rapidly. 0     - Dead  Karnofsky DA, Abelmann [place], Craver LS and Burchenal [facility] [phone]) The use of the nitrogen mustards in the palliative treatment of carcinoma: with particular reference to  bronchogenic carcinoma Cancer 1 634-56  LABORATORY DATA:  Lab Results  Component Value Date   WBC 7.9 10/06/2018   HGB 7.4 (L) 10/06/2018   HCT 23.5 (L) 10/06/2018   MCV 96.3 10/06/2018   PLT 101 (L) 10/06/2018   Lab Results  Component Value Date   NA 142 10/06/2018   K 3.2 (L) 10/06/2018   CL 110 10/06/2018   CO2 24 10/06/2018   Lab Results  Component Value Date   ALT 34 10/03/2018   AST 29 10/03/2018   ALKPHOS 46 10/03/2018   BILITOT 0.3 10/03/2018     RADIOGRAPHY: No results found.    IMPRESSION/PLAN:  This visit was conducted via WebEx telemedicine to spare the patient unnecessary potential exposure in the healthcare setting during the current COVID-19 pandemic. 1. 23 y.o. 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  Today, we talked to the patient and his mother, Lavella Hammock about the findings and workup thus far. We discussed the natural history of metastatic NSGCT/PNET and general treatment, highlighting the role of palliative radiotherapy in the management. We discussed the available radiation techniques, and focused on the details of logistics and delivery.  The recommendation is to proceed with a 10 fraction course of daily radiotherapy to include all sites of active disease on recent PET scan in bilateral pelvic lymph nodes and the left hip/acetabulum to alleviate/control pain and possibly slow symptomatic progression.  We reviewed the anticipated acute and late sequelae associated with radiation in this setting. The patient was encouraged to ask questions that were answered to his stated satisfaction.  His main concern with proceeding with treatment here locally was related to the current hospital restrictions prohibiting any visitors accompanying patients to treatments.  He has very high anxiety, especially related to proceeding with  cancer treatments without the direct support of his mother which often results in triggering panic attacks despite taking his prescribed medications.  We have discussed this in detail today and out of respect and concern for other cancer center patients, we have encouraged him to try undergoing his CT simulation/treatment planning solo in hopes that he can proceed with his 10 daily treatments in this manner as well.  However, given his high level of concern and uncertainty that he will be able to proceed without the support of his mother, we have discussed requesting an exception to this rule to allow his mother to accompany him inside the cancer center, donning appropriate PPE, for planning and treatments.  He is tentatively scheduled for CT simulation/treatment planning at 2 PM on Friday, 01/11/2019 in anticipation of beginning his radiation treatments on Monday, 01/14/2019.  Given current concerns for patient exposure during the COVID-19 pandemic, this encounter was conducted via telemedicine. The patient was notified in advance and was offered a [place] meeting to allow for face to face communication and has given verbal consent for this type of encounter. The time spent during this encounter was 60 minutes with more than 50% of that time spent in review of outside records and coordination of the patient's care. The attendants for this meeting include Tyler Pita MD, Ashlyn Bruning PA-C, patient,  Rankin Coolman and his mother, Zacory Fiola. During the encounter, Tyler Pita MD, Freeman Caldron PA-C were located at [facility] Radiation Oncology Department.  Patient, Robert Sperl and his mother, Lavella Hammock were located at [facility].   Nicholos Johns, PA-C    Tyler Pita, MD  [facility] Direct Dial: [phone]   Fax: [phone] [email or web address]   Skype   [email or web address]

## 2019-01-10 NOTE — Progress Notes (Signed)
See progress note under physician encounter. 

## 2019-01-11 ENCOUNTER — Ambulatory Visit
Admission: RE | Admit: 2019-01-11 | Discharge: 2019-01-11 | Disposition: A | Discharge status: Home / Self Care | Payer: Managed Care, Other (non HMO) | Source: Ambulatory Visit | Attending: Radiation Oncology | Admitting: Radiation Oncology

## 2019-01-11 DIAGNOSIS — C7951 Secondary malignant neoplasm of bone: Secondary | ICD-10-CM | POA: Diagnosis not present

## 2019-01-11 DIAGNOSIS — C6292 Malignant neoplasm of left testis, unspecified whether descended or undescended: Secondary | ICD-10-CM | POA: Diagnosis not present

## 2019-01-11 DIAGNOSIS — Z510 Encounter for antineoplastic radiation therapy: Secondary | ICD-10-CM | POA: Insufficient documentation

## 2019-01-11 DIAGNOSIS — C775 Secondary and unspecified malignant neoplasm of intrapelvic lymph nodes: Secondary | ICD-10-CM | POA: Insufficient documentation

## 2019-01-11 NOTE — Progress Notes (Signed)
  Radiation Oncology         (336) [phone] ________________________________  Name: Ricky Williams MRN: [id number]  Date: 01/11/2019  DOB: 10-28-1995  SIMULATION AND TREATMENT PLANNING NOTE    ICD-10-CM   1. 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  Identity was confirmed.  All relevant records and images related to the planned course of therapy were reviewed.  The patient freely provided informed written consent to proceed with treatment after reviewing the details related to the planned course of therapy. The consent form was witnessed and verified by the simulation staff.  Then, the patient was set-up in a stable reproducible  supine position for radiation therapy.  CT images were obtained.  Surface markings were placed.  The CT images were loaded into the planning software.  Then the target and avoidance structures were contoured.  Treatment planning then occurred.  The radiation prescription was entered and confirmed.  Then, I designed and supervised the construction of a total of at least 3 medically necessary complex treatment devices in the form of multileaf collimators [facility] [address]).  I have requested : 3D Simulation  I have requested a DVH of the following structures: bladder, rectum and target.  PLAN:  The patient will receive 30 Gy in 10 fractions.  ________________________________  Sheral Apley Tammi Klippel, M.D.  This document serves as a record of services personally performed by Tyler Pita, MD. It was created on his behalf by Wilburn Mylar, a trained medical scribe. The creation of this record is based on the scribe's personal  observations and the provider's statements to them. This document has been checked and approved by the attending provider.

## 2019-01-14 ENCOUNTER — Ambulatory Visit
Admission: RE | Admit: 2019-01-14 | Discharge: 2019-01-14 | Disposition: A | Discharge status: Home / Self Care | Payer: Managed Care, Other (non HMO) | Source: Ambulatory Visit | Attending: Radiation Oncology | Admitting: Radiation Oncology

## 2019-01-14 DIAGNOSIS — Z510 Encounter for antineoplastic radiation therapy: Secondary | ICD-10-CM | POA: Diagnosis not present

## 2019-01-15 ENCOUNTER — Ambulatory Visit: Payer: Managed Care, Other (non HMO)

## 2019-01-15 ENCOUNTER — Ambulatory Visit
Admission: RE | Admit: 2019-01-15 | Discharge: 2019-01-15 | Disposition: A | Discharge status: Home / Self Care | Payer: Managed Care, Other (non HMO) | Source: Ambulatory Visit | Attending: Radiation Oncology | Admitting: Radiation Oncology

## 2019-01-15 ENCOUNTER — Other Ambulatory Visit: Payer: Self-pay

## 2019-01-15 DIAGNOSIS — Z510 Encounter for antineoplastic radiation therapy: Secondary | ICD-10-CM | POA: Diagnosis not present

## 2019-01-16 ENCOUNTER — Ambulatory Visit
Admission: RE | Admit: 2019-01-16 | Discharge: 2019-01-16 | Disposition: A | Discharge status: Home / Self Care | Payer: Managed Care, Other (non HMO) | Source: Ambulatory Visit | Attending: Radiation Oncology | Admitting: Radiation Oncology

## 2019-01-16 ENCOUNTER — Ambulatory Visit: Payer: Managed Care, Other (non HMO)

## 2019-01-16 DIAGNOSIS — Z510 Encounter for antineoplastic radiation therapy: Secondary | ICD-10-CM | POA: Diagnosis not present

## 2019-01-17 ENCOUNTER — Ambulatory Visit
Admission: RE | Admit: 2019-01-17 | Discharge: 2019-01-17 | Disposition: A | Discharge status: Home / Self Care | Payer: Managed Care, Other (non HMO) | Source: Ambulatory Visit | Attending: Radiation Oncology | Admitting: Radiation Oncology

## 2019-01-17 ENCOUNTER — Ambulatory Visit: Payer: Managed Care, Other (non HMO)

## 2019-01-17 DIAGNOSIS — Z510 Encounter for antineoplastic radiation therapy: Secondary | ICD-10-CM | POA: Diagnosis not present

## 2019-01-17 MED FILL — NORMAL SALINE FLUSH SYRINGE: 0.9 | 30 days supply | Qty: 900 | Fill #0

## 2019-01-18 ENCOUNTER — Ambulatory Visit
Admission: RE | Admit: 2019-01-18 | Discharge: 2019-01-18 | Disposition: A | Discharge status: Home / Self Care | Payer: Managed Care, Other (non HMO) | Source: Ambulatory Visit | Attending: Radiation Oncology | Admitting: Radiation Oncology

## 2019-01-18 ENCOUNTER — Other Ambulatory Visit: Payer: Self-pay

## 2019-01-18 ENCOUNTER — Ambulatory Visit: Payer: Managed Care, Other (non HMO)

## 2019-01-18 DIAGNOSIS — Z510 Encounter for antineoplastic radiation therapy: Secondary | ICD-10-CM | POA: Diagnosis not present

## 2019-01-21 ENCOUNTER — Ambulatory Visit
Admission: RE | Admit: 2019-01-21 | Discharge: 2019-01-21 | Disposition: A | Discharge status: Home / Self Care | Payer: Managed Care, Other (non HMO) | Source: Ambulatory Visit | Attending: Radiation Oncology | Admitting: Radiation Oncology

## 2019-01-21 ENCOUNTER — Ambulatory Visit: Payer: Managed Care, Other (non HMO)

## 2019-01-21 DIAGNOSIS — Z510 Encounter for antineoplastic radiation therapy: Secondary | ICD-10-CM | POA: Diagnosis not present

## 2019-01-22 ENCOUNTER — Ambulatory Visit
Admission: RE | Admit: 2019-01-22 | Discharge: 2019-01-22 | Disposition: A | Discharge status: Home / Self Care | Payer: Managed Care, Other (non HMO) | Source: Ambulatory Visit | Attending: Radiation Oncology | Admitting: Radiation Oncology

## 2019-01-22 DIAGNOSIS — Z510 Encounter for antineoplastic radiation therapy: Secondary | ICD-10-CM | POA: Diagnosis not present

## 2019-01-23 ENCOUNTER — Ambulatory Visit
Admission: RE | Admit: 2019-01-23 | Discharge: 2019-01-23 | Disposition: A | Discharge status: Home / Self Care | Payer: Managed Care, Other (non HMO) | Source: Ambulatory Visit | Attending: Radiation Oncology | Admitting: Radiation Oncology

## 2019-01-23 ENCOUNTER — Ambulatory Visit: Payer: 59 | Admitting: Psychology

## 2019-01-23 DIAGNOSIS — Z510 Encounter for antineoplastic radiation therapy: Secondary | ICD-10-CM | POA: Diagnosis not present

## 2019-01-24 ENCOUNTER — Ambulatory Visit
Admission: RE | Admit: 2019-01-24 | Discharge: 2019-01-24 | Disposition: A | Discharge status: Home / Self Care | Payer: Managed Care, Other (non HMO) | Source: Ambulatory Visit | Attending: Radiation Oncology | Admitting: Radiation Oncology

## 2019-01-24 DIAGNOSIS — Z510 Encounter for antineoplastic radiation therapy: Secondary | ICD-10-CM | POA: Diagnosis not present

## 2019-01-25 ENCOUNTER — Encounter: Payer: Self-pay | Admitting: Radiation Oncology

## 2019-01-25 ENCOUNTER — Ambulatory Visit
Admission: RE | Admit: 2019-01-25 | Discharge: 2019-01-25 | Disposition: A | Discharge status: Home / Self Care | Payer: Managed Care, Other (non HMO) | Source: Ambulatory Visit | Attending: Radiation Oncology | Admitting: Radiation Oncology

## 2019-01-25 ENCOUNTER — Ambulatory Visit: Payer: Managed Care, Other (non HMO)

## 2019-01-25 DIAGNOSIS — Z510 Encounter for antineoplastic radiation therapy: Secondary | ICD-10-CM | POA: Diagnosis not present

## 2019-01-29 ENCOUNTER — Ambulatory Visit: Payer: Managed Care, Other (non HMO)

## 2019-02-06 ENCOUNTER — Ambulatory Visit (INDEPENDENT_AMBULATORY_CARE_PROVIDER_SITE_OTHER): Payer: 59 | Admitting: Psychology

## 2019-02-06 DIAGNOSIS — F331 Major depressive disorder, recurrent, moderate: Secondary | ICD-10-CM

## 2019-02-20 ENCOUNTER — Ambulatory Visit: Payer: 59 | Admitting: Psychology

## 2019-02-27 ENCOUNTER — Telehealth: Payer: Self-pay | Admitting: Radiation Oncology

## 2019-02-28 ENCOUNTER — Ambulatory Visit
Admission: RE | Admit: 2019-02-28 | Discharge: 2019-02-28 | Disposition: A | Discharge status: Home / Self Care | Payer: Managed Care, Other (non HMO) | Source: Ambulatory Visit | Attending: Urology | Admitting: Urology

## 2019-02-28 DIAGNOSIS — C779 Secondary and unspecified malignant neoplasm of lymph node, unspecified: Secondary | ICD-10-CM

## 2019-02-28 DIAGNOSIS — C7951 Secondary malignant neoplasm of bone: Secondary | ICD-10-CM

## 2019-02-28 NOTE — Progress Notes (Signed)
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   Narrative:  I spoke with the patient to conduct his routine scheduled 1 month follow up visit via telephone to spare the patient unnecessary potential exposure in the healthcare setting during the current COVID-19 pandemic.  The patient was notified in advance and gave permission to proceed with this visit format.   He tolerated treatment relatively well without any significant ill side effects.  He denied any pain, fatigue, skin irritation or numbness/ tingling in his extremities   He developed some mild swelling in the left leg which improved spontaneously during treatment.  He also developed some abdominal cramping, nausea and diarrhea 1 day after completing radiation and this spontaneously resolved after 1 week.   He had recent follow up/restaging imaging with CT A/P on 02/09/19 at [facility] which unfortunately showed evidence of disease progression in the nodes in the pelvis and retroperitoneum above his radiation field. There was no hydronephrosis with a PCN in place.  He had a follow-up visit with Dr. Iona Beard at [facility] on 02/18/2019 and started palliative chemotherapy with irinotecan/temozolomide/vincristine.                        On review of systems, the patient states that he is feeling pretty poorly overall with generalized weakness, malaise, decreased appetite and nausea.  He has also had  intermittent abdominal pain and diarrhea over the past 2 weeks since starting his systemic chemotherapy.  He has lost approximately 10 pounds over the past month due to decreased appetite and abdominal symptoms, first associated with the radiation and more recently associated with his systemic chemo.  He denies chest pain, shortness of breath, productive cough, fever, chills or night sweats.  ALLERGIES:  is allergic to adhesive [tape] and emend [aprepitant].  Meds: Current Outpatient Medications  Medication Sig Dispense Refill  . acetaminophen (TYLENOL) 325 MG tablet Take 650 mg by mouth every 6 (six) hours as needed for mild pain or fever.     . ALPRAZolam (XANAX XR) 0.5 MG 24 hr tablet Take 1 tablet (0.5 mg total) by mouth daily. (Patient taking differently: Take 0.5 mg by mouth daily as needed for anxiety. ) 30 tablet 5  . buPROPion (WELLBUTRIN SR) 100 MG 12 hr tablet Take 1 tablet (100 mg total) by mouth 2 (two) times daily. 180 tablet 1  . chlorhexidine (PERIDEX) 0.12 % solution 30 mLs by Mouth Rinse route daily.    Marland Kitchen dexamethasone (DECADRON) 4 MG tablet Take 4 mg by mouth daily. 3 days following chemo  0  . lurasidone (LATUDA) 20 MG TABS tablet Take 1 tablet (20 mg total) by mouth Nightly. 30 tablet 11  . mirtazapine (REMERON) 7.5 MG tablet Take 1 tablet (7.5 mg total) by mouth at bedtime. 90 tablet 1  . Multiple Vitamins-Minerals (MULTIVITAMIN WITH MINERALS) tablet Take 1 tablet by mouth daily.    . NEULASTA 6 MG/0.6ML injection     .  OLANZapine (ZYPREXA) 5 MG tablet Take 1-2 tablets (5-10 mg total) by mouth at bedtime. For chemotherapy induced nausea. (Patient not taking: Reported on 01/10/2019) 40 tablet 1  . ondansetron (ZOFRAN) 8 MG tablet Take 8 mg by mouth 3 (three) times daily as needed for nausea/vomiting.    Marland Kitchen oxyCODONE (OXY IR/ROXICODONE) 5 MG immediate release tablet TAKE 1 2 TABLETS BY MOUTH EVERY 4 HOURS AS NEEDED FOR PAIN(1 TAB   MODERATE PAIN 2 TAB  SEVERE PAIN)    .  Polyethylene Glycol 3350 (PEG 3350) POWD Take 17 g by mouth daily as needed for constipation.    . predniSONE (DELTASONE) 10 MG tablet TAKE 2 TABLETS BY MOUTH EVERY DAY WITH BREAKFAST FOR 3 DAYS    . prochlorperazine (COMPAZINE) 10 MG tablet Take 1 tablet (10 mg total) by mouth once for 1 dose. (Patient taking differently: Take 10 mg by mouth daily as needed for nausea. ) 30 tablet 0  . Sodium Chloride Flush (NORMAL SALINE FLUSH) 0.9 % SOLN     . vortioxetine HBr (TRINTELLIX) 20 MG TABS tablet Take 1 tablet (20 mg total) by mouth Nightly. 30 tablet 11   No current facility-administered medications for this encounter.     Physical Findings:  vitals were not taken for this visit.   /Unable to assess due to telephone follow-up visit format.  Lab Findings: Lab Results  Component Value Date   WBC 7.9 10/06/2018   HGB 7.4 (L) 10/06/2018   HCT 23.5 (L) 10/06/2018   MCV 96.3 10/06/2018   PLT 101 (L) 10/06/2018     Radiographic Findings: No results found.  Impression/Plan: 1. 23 y.o. 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 He appears to have recovered well from the effects of his recent radiotherapy and has recently started a new regimen of palliative chemotherapy.  Unfortunately, this is making him feel quite poorly.  I encouraged him and his mother to ensure that he is getting plenty of fluids throughout the day as well as supplementing his diet with protein shakes such as Boost or Ensure given his lack of appetite and significantly decreased oral intake.  We discussed that while we are happy to continue to participate in his care if clinically indicated, at this point, we will plan to see him back on an as-needed basis.  He will continue in routine follow-up under the care and direction of Dr. Lawanna Kobus at [facility] for  continued management of his systemic disease.  He knows to call at anytime with any questions or concerns related to his previous radiotherapy.    Nicholos Johns, PA-C

## 2019-03-06 ENCOUNTER — Ambulatory Visit: Payer: 59 | Admitting: Psychology

## 2019-03-06 IMAGING — CR DG CHEST 2V
1 series · 2 of 2 positions shown · non-contrast
Comparison: No priors.

CLINICAL DATA: 21-year-old male with history of preoperative
evaluation prior to ECT treatment.

EXAM:
CHEST  2 VIEW

[Series 1: w chest pa · 0.14mm/px · 2 of 2 slices shown]
[im 1/2]
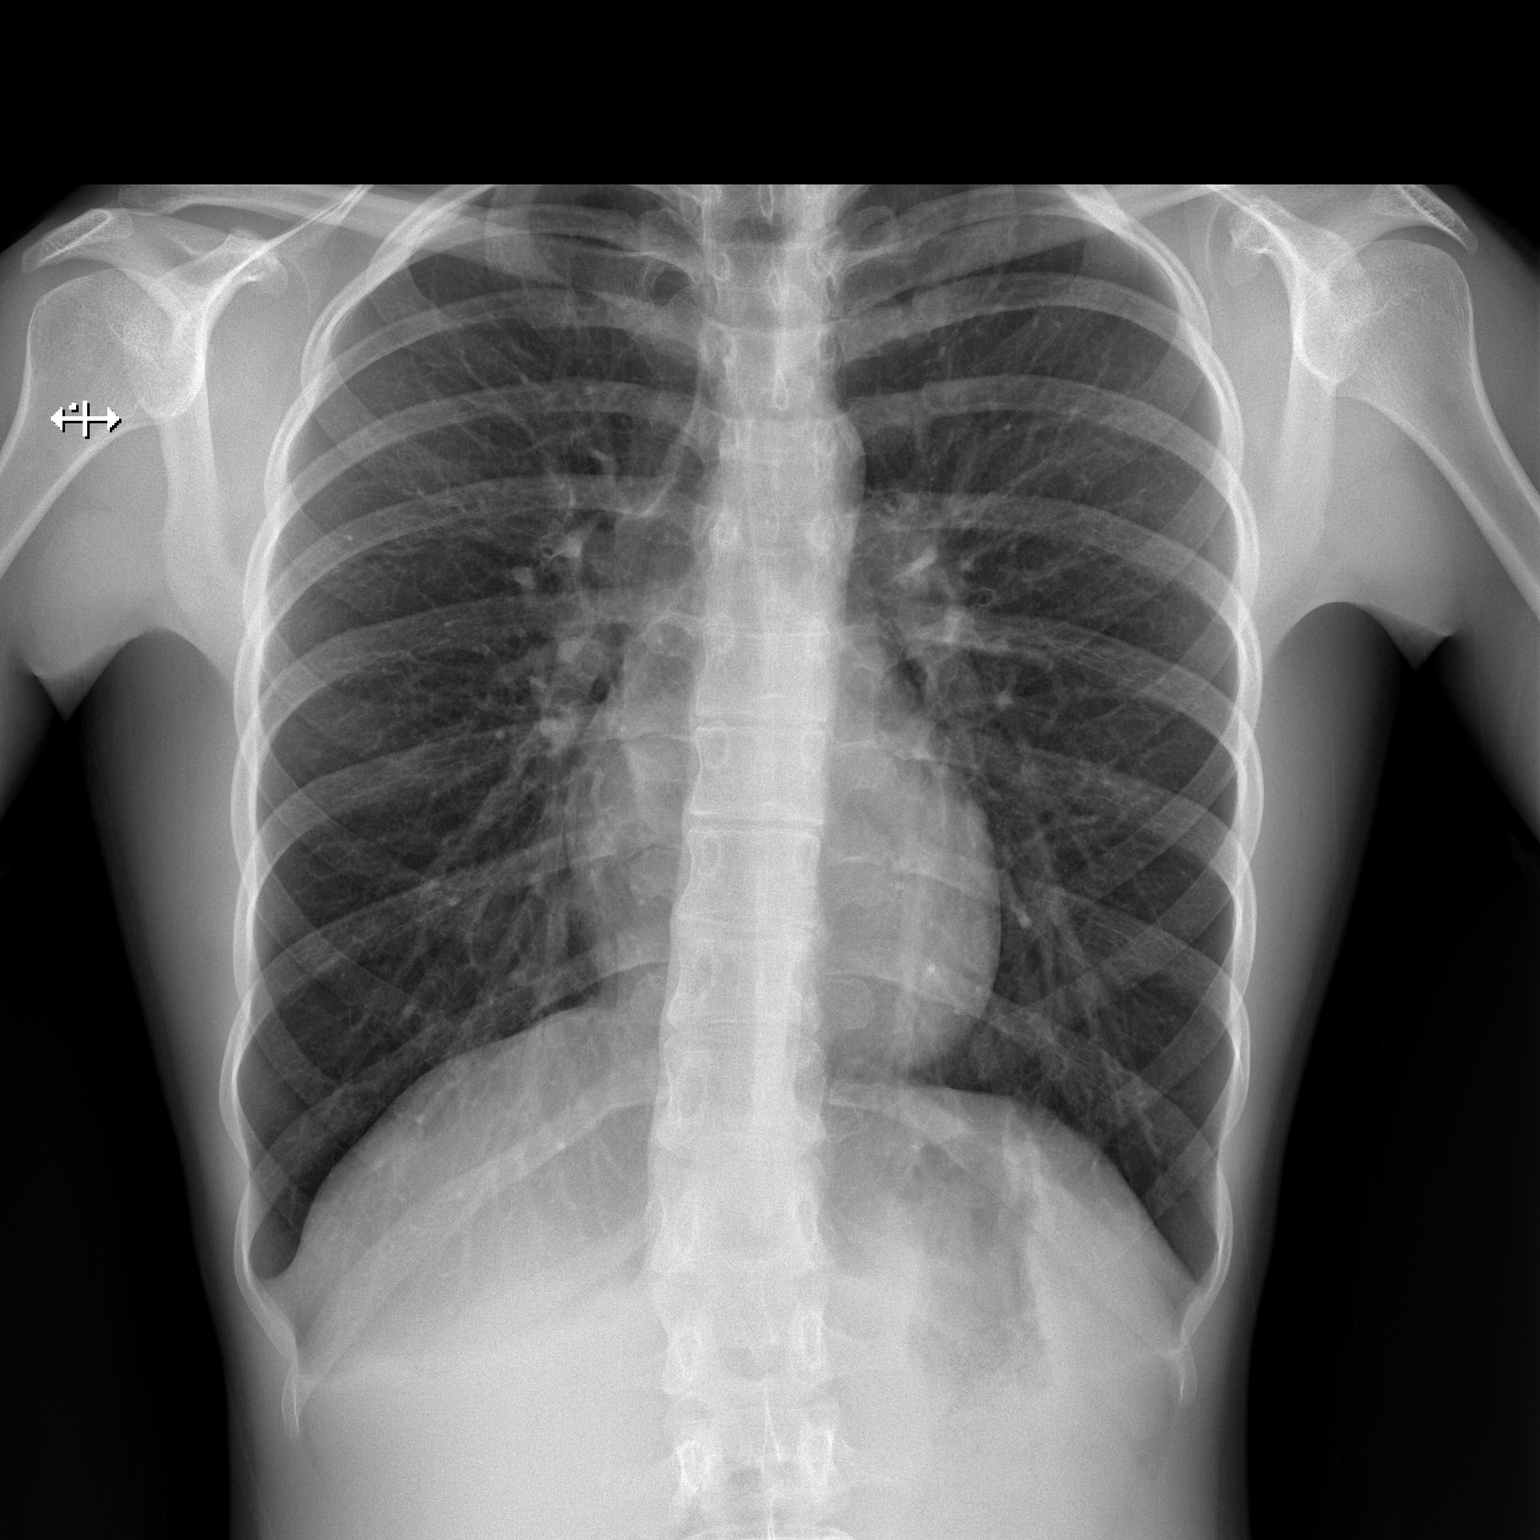
[im 2/2]
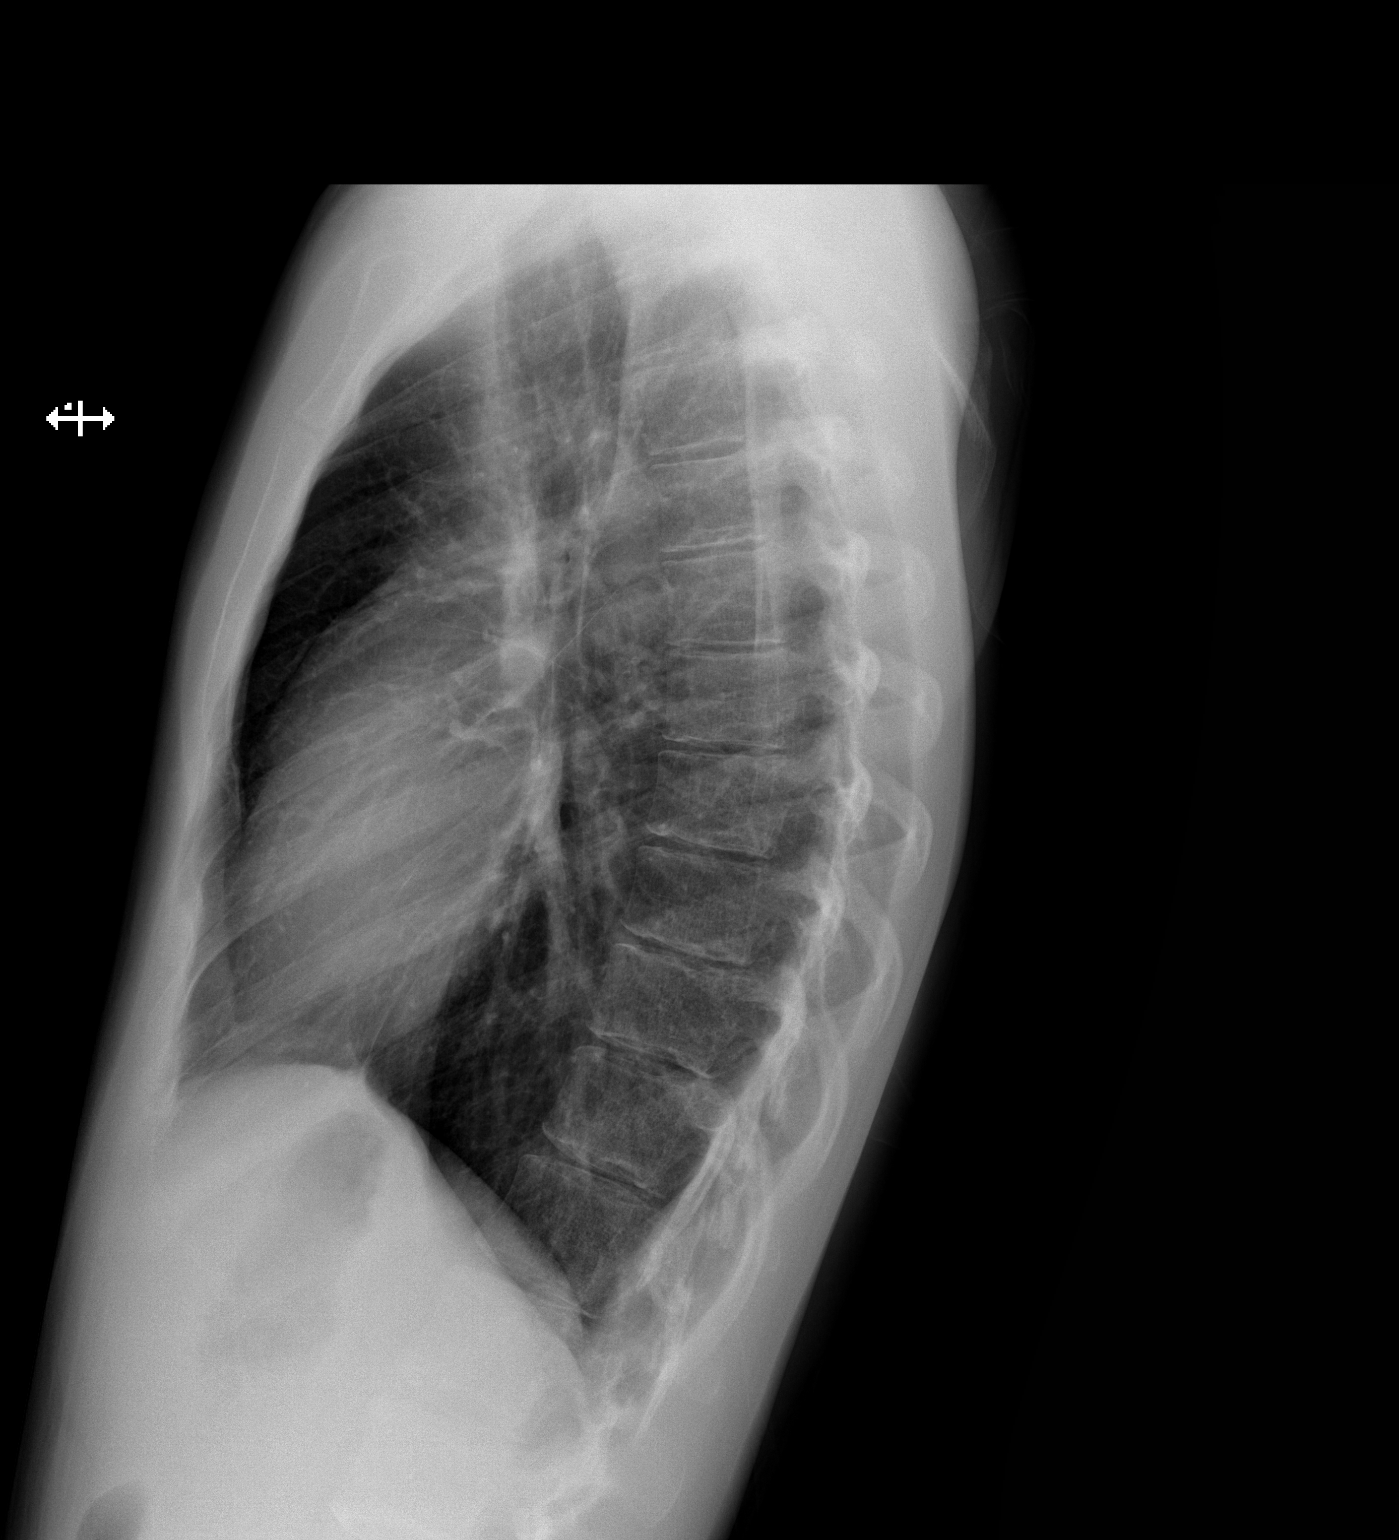

[2 of 2 positions shown; findings below may reference images not displayed]

FINDINGS: Lung volumes are normal. No consolidative airspace disease. No
pleural effusions. No pneumothorax. No pulmonary nodule or mass
noted. Pulmonary vasculature and the cardiomediastinal silhouette
are within normal limits.
IMPRESSION: No radiographic evidence of acute cardiopulmonary disease.

## 2019-03-06 MED FILL — NORMAL SALINE FLUSH SYRINGE: 0.9 | 30 days supply | Qty: 900 | Fill #0

## 2019-03-20 ENCOUNTER — Telehealth: Payer: Self-pay | Admitting: Psychiatry

## 2019-03-20 ENCOUNTER — Ambulatory Visit (INDEPENDENT_AMBULATORY_CARE_PROVIDER_SITE_OTHER): Payer: 59 | Admitting: Psychology

## 2019-03-20 DIAGNOSIS — F411 Generalized anxiety disorder: Secondary | ICD-10-CM

## 2019-03-20 DIAGNOSIS — F331 Major depressive disorder, recurrent, moderate: Secondary | ICD-10-CM

## 2019-03-20 DIAGNOSIS — F4010 Social phobia, unspecified: Secondary | ICD-10-CM

## 2019-03-20 DIAGNOSIS — F339 Major depressive disorder, recurrent, unspecified: Secondary | ICD-10-CM

## 2019-03-20 MED ORDER — MIRTAZAPINE 15 MG PO TABS: 15.0000 mg | ORAL_TABLET | Freq: Every day | ORAL | 0 refills | Status: DC

## 2019-03-20 NOTE — Telephone Encounter (Signed)
Mother phones that continued chemotherapy for cancer leaves major depression and generalized anxiety needing more medication benefit particularly at night.  Remeron is increased from 7.5 to 15 mg tablet every bedtime sent as #90 [facility] in [place] medically necessary with no contraindication other than relative serotonergic facilitation as also from [employer]

## 2019-03-20 NOTE — Telephone Encounter (Signed)
Pt is on 7.5mg  of Remeron and mother would like to increase it to 15 due to pt radiation and chemo treatments and weight loss.. [facility] in [place].

## 2019-03-27 NOTE — Progress Notes (Signed)
  Radiation Oncology         [phone] ________________________________  Name: Ricky Williams MRN: [id number]  Date: 01/25/2019  DOB: 03-18-1996  End of Treatment Note  Diagnosis:   23 y.o. 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     Indication for treatment:  Palliative       Radiation treatment dates:   01/14/2019 - 01/25/2019  Site/dose:   The patient received 30Gy in 56fractions to the left hip/acetabulum and involved bilateral pelvic lymph nodes.  Beams/energy:   3D, photons / 6X  Narrative: The patient tolerated radiation treatment relatively well. He denied any pain, fatigue, skin issues, and numbness or tingling in his extremities. He reported some swelling in his leg that improved as treatments went on.    Plan: The patient has completed radiation treatment. The patient will return to radiation oncology clinic for routine followup in one month. I advised him to call or return sooner if he has any questions or concerns related to his recovery or treatment. ________________________________  Sheral Apley. Tammi Klippel, M.D.   This document serves as a record of services personally performed by Tyler Pita, MD. It was created on his behalf by Wilburn Mylar, a trained medical scribe. The creation of this record is based on the scribe's personal observations and the provider's statements to them. This document has been checked and approved by the attending provider.

## 2019-04-03 ENCOUNTER — Ambulatory Visit: Payer: 59 | Admitting: Psychology

## 2019-04-10 MED FILL — NORMAL SALINE FLUSH SYRINGE: 0.9 | 30 days supply | Qty: 900 | Fill #1

## 2019-04-16 MED FILL — AMOXICILLIN 500 MG CAPSULE: 500 | 10 days supply | Qty: 30 | Fill #0

## 2019-04-17 ENCOUNTER — Ambulatory Visit: Payer: 59 | Admitting: Psychology

## 2019-05-01 ENCOUNTER — Ambulatory Visit (INDEPENDENT_AMBULATORY_CARE_PROVIDER_SITE_OTHER): Payer: 59 | Admitting: Psychology

## 2019-05-01 DIAGNOSIS — F331 Major depressive disorder, recurrent, moderate: Secondary | ICD-10-CM

## 2019-05-15 ENCOUNTER — Ambulatory Visit: Payer: 59 | Admitting: Psychology

## 2019-05-29 ENCOUNTER — Ambulatory Visit (INDEPENDENT_AMBULATORY_CARE_PROVIDER_SITE_OTHER): Payer: 59 | Admitting: Psychology

## 2019-05-29 DIAGNOSIS — F331 Major depressive disorder, recurrent, moderate: Secondary | ICD-10-CM | POA: Diagnosis not present

## 2019-06-03 MED FILL — NORMAL SALINE FLUSH SYRINGE: 0.9 | 30 days supply | Qty: 900 | Fill #2

## 2019-06-12 ENCOUNTER — Ambulatory Visit: Payer: 59 | Admitting: Psychology

## 2019-06-12 ENCOUNTER — Other Ambulatory Visit: Payer: Self-pay | Admitting: Psychiatry

## 2019-06-12 DIAGNOSIS — F4010 Social phobia, unspecified: Secondary | ICD-10-CM

## 2019-06-12 DIAGNOSIS — F339 Major depressive disorder, recurrent, unspecified: Secondary | ICD-10-CM

## 2019-06-12 DIAGNOSIS — F900 Attention-deficit hyperactivity disorder, predominantly inattentive type: Secondary | ICD-10-CM

## 2019-06-12 DIAGNOSIS — F332 Major depressive disorder, recurrent severe without psychotic features: Secondary | ICD-10-CM

## 2019-06-12 DIAGNOSIS — F411 Generalized anxiety disorder: Secondary | ICD-10-CM

## 2019-06-19 ENCOUNTER — Other Ambulatory Visit: Payer: Self-pay | Admitting: Psychiatry

## 2019-06-19 DIAGNOSIS — F339 Major depressive disorder, recurrent, unspecified: Secondary | ICD-10-CM

## 2019-06-19 DIAGNOSIS — F411 Generalized anxiety disorder: Secondary | ICD-10-CM

## 2019-06-19 DIAGNOSIS — F4010 Social phobia, unspecified: Secondary | ICD-10-CM

## 2019-06-19 NOTE — Telephone Encounter (Signed)
Last appointment 12/31/2018 Remeron 7.5 mg nightly doubled to 15 mg ODT nightly on 03/20/2019 by phone call to have appointment tomorrow but needing #90 of the Remeron tonight for dosing before appointment tomorrow.

## 2019-06-19 NOTE — Telephone Encounter (Signed)
appt tomorrow 10/15

## 2019-06-20 ENCOUNTER — Encounter: Payer: Self-pay | Admitting: Psychiatry

## 2019-06-20 ENCOUNTER — Other Ambulatory Visit: Payer: Self-pay

## 2019-06-20 ENCOUNTER — Ambulatory Visit (INDEPENDENT_AMBULATORY_CARE_PROVIDER_SITE_OTHER): Payer: 59 | Admitting: Psychiatry

## 2019-06-20 DIAGNOSIS — F339 Major depressive disorder, recurrent, unspecified: Secondary | ICD-10-CM

## 2019-06-20 DIAGNOSIS — F800 Phonological disorder: Secondary | ICD-10-CM

## 2019-06-20 DIAGNOSIS — F900 Attention-deficit hyperactivity disorder, predominantly inattentive type: Secondary | ICD-10-CM

## 2019-06-20 DIAGNOSIS — F4010 Social phobia, unspecified: Secondary | ICD-10-CM

## 2019-06-20 DIAGNOSIS — F411 Generalized anxiety disorder: Secondary | ICD-10-CM | POA: Diagnosis not present

## 2019-06-20 DIAGNOSIS — F82 Specific developmental disorder of motor function: Secondary | ICD-10-CM

## 2019-06-20 MED ORDER — VORTIOXETINE HBR 20 MG PO TABS: ORAL_TABLET | ORAL | 5 refills | Status: DC

## 2019-06-20 MED ORDER — BUPROPION HCL ER (SR) 100 MG PO TB12: 100.0000 mg | ORAL_TABLET | Freq: Two times a day (BID) | ORAL | 1 refills | Status: DC

## 2019-06-20 MED ORDER — LATUDA 20 MG PO TABS: ORAL_TABLET | ORAL | 5 refills | Status: DC

## 2019-06-20 MED ORDER — MIRTAZAPINE 15 MG PO TABS: ORAL_TABLET | ORAL | 0 refills | Status: DC

## 2019-06-20 NOTE — Progress Notes (Signed)
[facility]  Patient ID: MAC DEFORE,  MRN: [id number]  PCP: Gaynelle Arabian, MD  Date of Evaluation: 06/20/2019 Time spent:20 minutes from 1500 to 1520  Chief Complaint:  Chief Complaint    Depression; Anxiety; ADHD      HISTORY/CURRENT STATUS: Ricky Williams is provided telemedicine audiovisual appointment session, though they decline the video camera for social anxiety, phone to phone conjointly with mother with consent with epic collateral for psychiatric interview and exam in 69-month evaluation and management of treatment resistant recurrent depression, generalized and social anxiety, and ADHD with developmental coordination and speech sound disorders.  Oncological treatment continues with disease overwhelming so that Ricky Williams has no way to discuss further education or future career planning.  In the interim, he hs apparently had metastases to bone requiring pelvic radiation and has a left ureteral obstruction requiring left nephrostomy with bag.  Mother phoned in mid July to increase Remeron from 7.5 to 15 mg nightly for insomnia, inability to maintain weight, and increased anxiety.  Patient speaks independently and calmly as he and mother allow appreciation for mother's loving management of his affairs and patient's fortitude and independence in cooperating with all necessary medical care.  I have not received further contact from mother requesting psychiatric necessity for her attendance to all of his in-hospital care during the coronavirus pandemic, though she does not clarify outcome of her efforts.  Psychiatric systems review concludes the need for all medications currently underway.  They process the need for Ativan at times for oncological treatment while also having the Xanax for psychiatric treatment, alternating between these and never combining.  He has no mania, psychosis, suicidality, or delirium currently.  Depression  The patient presents with depression as a recurrent  treatment resistant problem.  The current episode started more than 1 year ago.   The onset quality is gradual.   The problem occurs constantly.  The problem has been waxing and waning but persistent since onset.  Associated symptoms include decreased concentration, decreased interest, anorexia with difficulty maintaining weight, fatigue, helplessness, hopelessness and sad.  Associated symptoms include not irritable, no headaches, no indigestion and no suicidal ideas.     The symptoms are aggravated by medication, social issues and family issues.  Past treatments include SNRIs - Serotonin and norepinephrine reuptake inhibitors, TCAs - Tricyclic antidepressants, SSRIs - Selective serotonin reuptake inhibitors, psychotherapy and other medications.  Compliance with treatment is good.  Past compliance problems include difficulty with treatment plan, medication issues and medical issues.  Previous treatment provided mild relief.  Risk factors include a recent illness, family history, family history of mental illness, history of mental illness and major life event.   Past medical history includes recent illness, life-threatening condition, physical disability, anxiety, depression and mental health disorder.     Pertinent negatives include no recent psychiatric admission, no bipolar disorder, no eating disorder, no obsessive-compulsive disorder, no post-traumatic stress disorder, no schizophrenia, no suicide attempts and no head trauma.  Individual Medical History/ Review of Systems: Changes? 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  They do process his psychiatric intolerance for dexamethasone for his nausea making him restless with anxiety but reducing the dose significantly still made depression worse so that a replacement for the dexamethasone was found.  [place] registry is appropriate for medical needs and  use.  Allergies: Adhesive [tape] and Emend [aprepitant]  Current Medications:  Current Outpatient Medications:  .  acetaminophen (TYLENOL) 325  MG tablet, Take 650 mg by mouth every 6 (six) hours as needed for mild pain or fever. , Disp: , Rfl:  .  ALPRAZolam (XANAX XR) 0.5 MG 24 hr tablet, Take 1 tablet (0.5 mg total) by mouth daily. (Patient taking differently: Take 0.5 mg by mouth daily as needed for anxiety. ), Disp: 30 tablet, Rfl: 5 .  buPROPion (WELLBUTRIN SR) 100 MG 12 hr tablet, Take 1 tablet (100 mg total) by mouth 2 (two) times daily., Disp: 180 tablet, Rfl: 1 .  chlorhexidine (PERIDEX) 0.12 % solution, 30 mLs by Mouth Rinse route daily., Disp: , Rfl:  .  dexamethasone (DECADRON) 4 MG tablet, Take 4 mg by mouth daily. 3 days following chemo, Disp: , Rfl: 0 .  lurasidone (LATUDA) 20 MG TABS tablet, TAKE 1 TABLET BY MOUTH EVERY DAY AT NIGHT, Disp: 30 tablet, Rfl: 5 .  mirtazapine (REMERON) 15 MG tablet, TAKE 1 TABLET BY MOUTH EVERYDAY AT BEDTIME, Disp: 90 tablet, Rfl: 0 .  Multiple Vitamins-Minerals (MULTIVITAMIN WITH MINERALS) tablet, Take 1 tablet by mouth daily., Disp: , Rfl:  .  NEULASTA 6 MG/0.6ML injection, , Disp: , Rfl:  .  OLANZapine (ZYPREXA) 5 MG tablet, Take 1-2 tablets (5-10 mg total) by mouth at bedtime. For chemotherapy induced nausea. (Patient not taking: Reported on 01/10/2019), Disp: 40 tablet, Rfl: 1 .  ondansetron (ZOFRAN) 8 MG tablet, Take 8 mg by mouth 3 (three) times daily as needed for nausea/vomiting., Disp: , Rfl:  .  oxyCODONE (OXY IR/ROXICODONE) 5 MG immediate release tablet, TAKE 1 2 TABLETS BY MOUTH EVERY 4 HOURS AS NEEDED FOR PAIN(1 TAB   MODERATE PAIN 2 TAB  SEVERE PAIN), Disp: , Rfl:  .  Polyethylene Glycol 3350 (PEG 3350) POWD, Take 17 g by mouth daily as needed for constipation., Disp: , Rfl:  .  predniSONE (DELTASONE) 10 MG tablet, TAKE 2 TABLETS BY MOUTH EVERY DAY WITH BREAKFAST FOR 3 DAYS, Disp: , Rfl:  .  prochlorperazine (COMPAZINE) 10 MG tablet, Take  1 tablet (10 mg total) by mouth once for 1 dose. (Patient taking differently: Take 10 mg by mouth daily as needed for nausea. ), Disp: 30 tablet, Rfl: 0 .  Sodium Chloride Flush (NORMAL SALINE FLUSH) 0.9 % SOLN, , Disp: , Rfl:  .  vortioxetine HBr (TRINTELLIX) 20 MG TABS tablet, TAKE 1 TABLET BY MOUTH EVERY DAY AT NIGHT, Disp: 30 tablet, Rfl: 5   Medication Side Effects: none  Family Medical/ Social History: Changes? No  MENTAL HEALTH EXAM:  There were no vitals taken for this visit.There is no height or weight on file to calculate BMI.  Not present in office today  General Appearance: N/A  Eye Contact:  N/A  Speech:  Blocked, Clear and Coherent, Normal Rate and Talkative  Volume:  Decreased  Mood:  Anxious, Depressed, Dysphoric and Hopeless  Affect:  Congruent, Constricted, Depressed, Restricted and Anxious  Thought Process:  Coherent, Irrelevant, Linear and Descriptions of Associations: Loose  Orientation:  Full (Time, Place, and Person)  Thought Content: Ilusions and Rumination   Suicidal Thoughts:  No  Homicidal Thoughts:  No  Memory:  Immediate;   Good and Fair Remote;   Good and Fair  Judgement:  Good  Insight:  Fair  Psychomotor Activity:  Decreased, Mannerisms and Psychomotor Retardation  Concentration:  Concentration: Fair and Attention Span: Fair  Recall:  [employer] of Knowledge: Fair  Language: Fair  Assets:  Desire for Improvement Housing Resilience  ADL's:  Intact  Cognition: WNL  Prognosis:  Poor    DIAGNOSES:    ICD-10-CM   1. Recurrent major depression resistant to treatment (HCC)  F33.9 lurasidone (LATUDA) 20 MG TABS tablet    vortioxetine HBr (TRINTELLIX) 20 MG TABS tablet    mirtazapine (REMERON) 15 MG tablet    buPROPion (WELLBUTRIN SR) 100 MG 12 hr tablet  2. Social anxiety disorder  F40.10 vortioxetine HBr (TRINTELLIX) 20 MG TABS tablet    mirtazapine (REMERON) 15 MG tablet  3. Generalized anxiety disorder  F41.1 vortioxetine HBr (TRINTELLIX) 20  MG TABS tablet    mirtazapine (REMERON) 15 MG tablet    buPROPion (WELLBUTRIN SR) 100 MG 12 hr tablet  4. ADHD, predominantly inattentive type  F90.0 vortioxetine HBr (TRINTELLIX) 20 MG TABS tablet    buPROPion (WELLBUTRIN SR) 100 MG 12 hr tablet  5. 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  He is E scribed Remeron 15 mg every bedtime having recent 90-day supply so that additional 90-[facility] [place] in [place] for social and generalized anxiety along with Wellbutrin 100 mg SR twice daily and as a 90-day supply and 1 refill for ADHD and depression.  He has supply of Xanax 0.5 mg daily as needed for social anxiety notify office of any need for refill as he also takes Ativan from oncology.  He returns for follow-up in 6 months.    Virtual Visit via Video Note  I connected with Ricky Williams on 06/20/19 at  3:00 PM EDT by a video enabled telemedicine application and verified that I am speaking with the correct person using two identifiers.  Location: Patient: Phone to phone conjointly with mother and [place] at family residence Provider: [facility] office   I discussed the limitations of evaluation and management by telemedicine and the availability of in person appointments. The patient expressed understanding and agreed to proceed.  History of Present Illness: 71-month evaluation and management address treatment resistant recurrent depression, generalized and social anxiety, and  ADHD with developmental coordination and speech sound disorders.  Oncological treatment continues with disease overwhelming so that Cha has no way to discuss further education or future career    Observations/Objective: Mood:  Anxious, Depressed, Dysphoric and Hopeless  Affect:  Congruent, Constricted, Depressed, Restricted and Anxious  Thought Process:  Coherent, Irrelevant, Linear and Descriptions of Associations: Loose  Orientation:  Full (Time, Place, and Person)  Thought Content: Ilusions and Rumination    Assessment and Plan:  Trintellix and Latuda require monthly eScription's with 5 refills [facility] a [facility] in [place] for bubble pack distribution especially due to economics sending as Trintellix 20 mg every evening meal and Latuda 20 mg every evening meal treatment resistant depression, social and generalized anxiety, and ADHD.  He is E scribed Remeron 15 mg every bedtime having recent 90-day supply so that additional 90-[facility] [place] in [place] for social and generalized anxiety along with Wellbutrin 100 mg SR twice daily and as a 90-day supply  and 1 refill for ADHD and depression.  He has supply of Xanax 0.5 mg daily as needed for social anxiety notify office of any need for refill as he also takes Ativan from oncology.  Follow Up Instructions: He returns for follow-up in 6 months.   I discussed the assessment and treatment plan with the patient. The patient was provided an opportunity to ask questions and all were answered. The patient agreed with the plan and demonstrated an understanding of the instructions.   The patient was advised to call back or seek an in-person evaluation if the symptoms worsen or if the condition fails to improve as anticipated.  I provided 20 minutes of non-face-to-face time during this encounter. [employer] meeting [id number] Meeting password:  [place] [id number]  Delight Hoh,  MD   Delight Hoh, MD

## 2019-06-26 ENCOUNTER — Ambulatory Visit (INDEPENDENT_AMBULATORY_CARE_PROVIDER_SITE_OTHER): Payer: 59 | Admitting: Psychology

## 2019-06-26 DIAGNOSIS — F331 Major depressive disorder, recurrent, moderate: Secondary | ICD-10-CM | POA: Diagnosis not present

## 2019-07-09 MED FILL — NORMAL SALINE FLUSH SYRINGE: 0.9 | 30 days supply | Qty: 900 | Fill #3

## 2019-07-10 ENCOUNTER — Ambulatory Visit (INDEPENDENT_AMBULATORY_CARE_PROVIDER_SITE_OTHER): Payer: 59 | Admitting: Psychology

## 2019-07-10 DIAGNOSIS — F331 Major depressive disorder, recurrent, moderate: Secondary | ICD-10-CM

## 2019-07-13 ENCOUNTER — Other Ambulatory Visit: Payer: Self-pay | Admitting: Psychiatry

## 2019-07-13 DIAGNOSIS — F339 Major depressive disorder, recurrent, unspecified: Secondary | ICD-10-CM

## 2019-07-23 IMAGING — CR DG CHEST 2V
2 series · 2 of 2 positions shown · non-contrast
Comparison: Chest CT November 09, 2017

CLINICAL DATA: Testicular cancer.  Follow-up.

EXAM:
CHEST - 2 VIEW

[w chest pa]
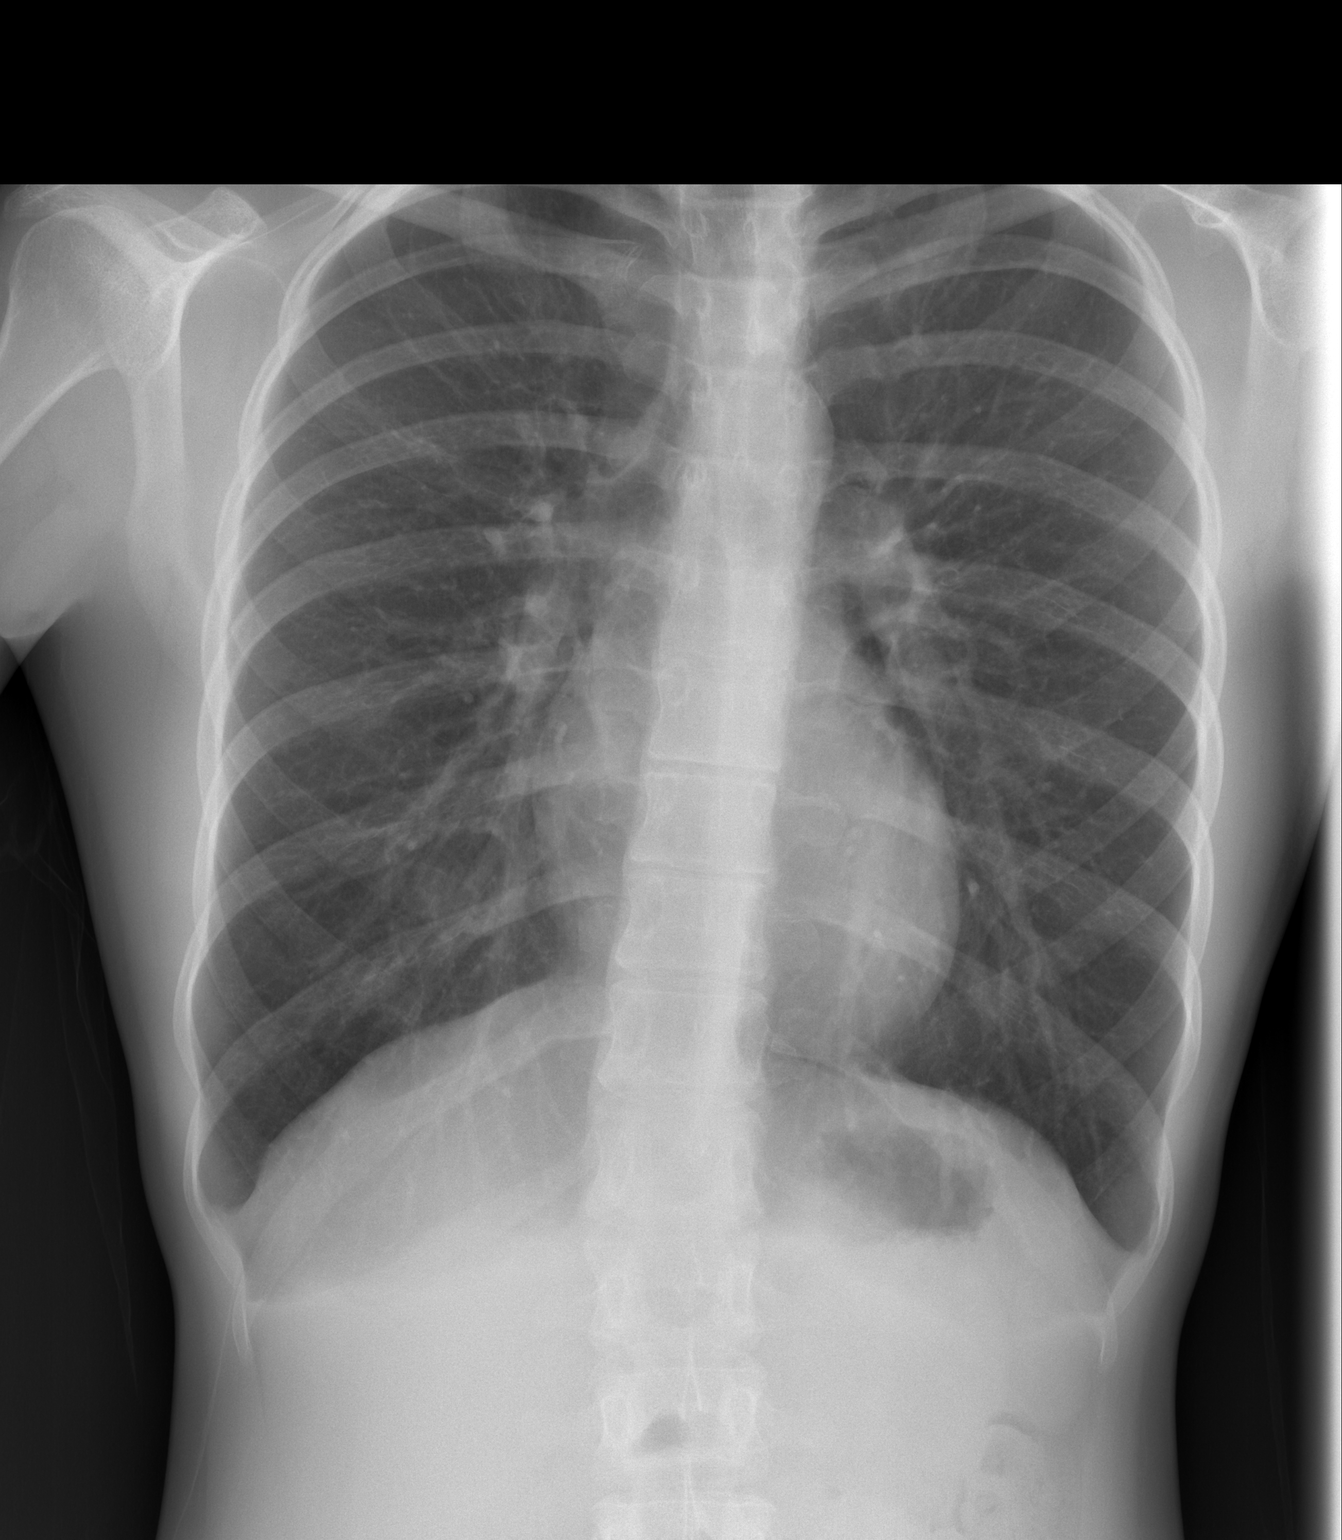

[w chest lat]
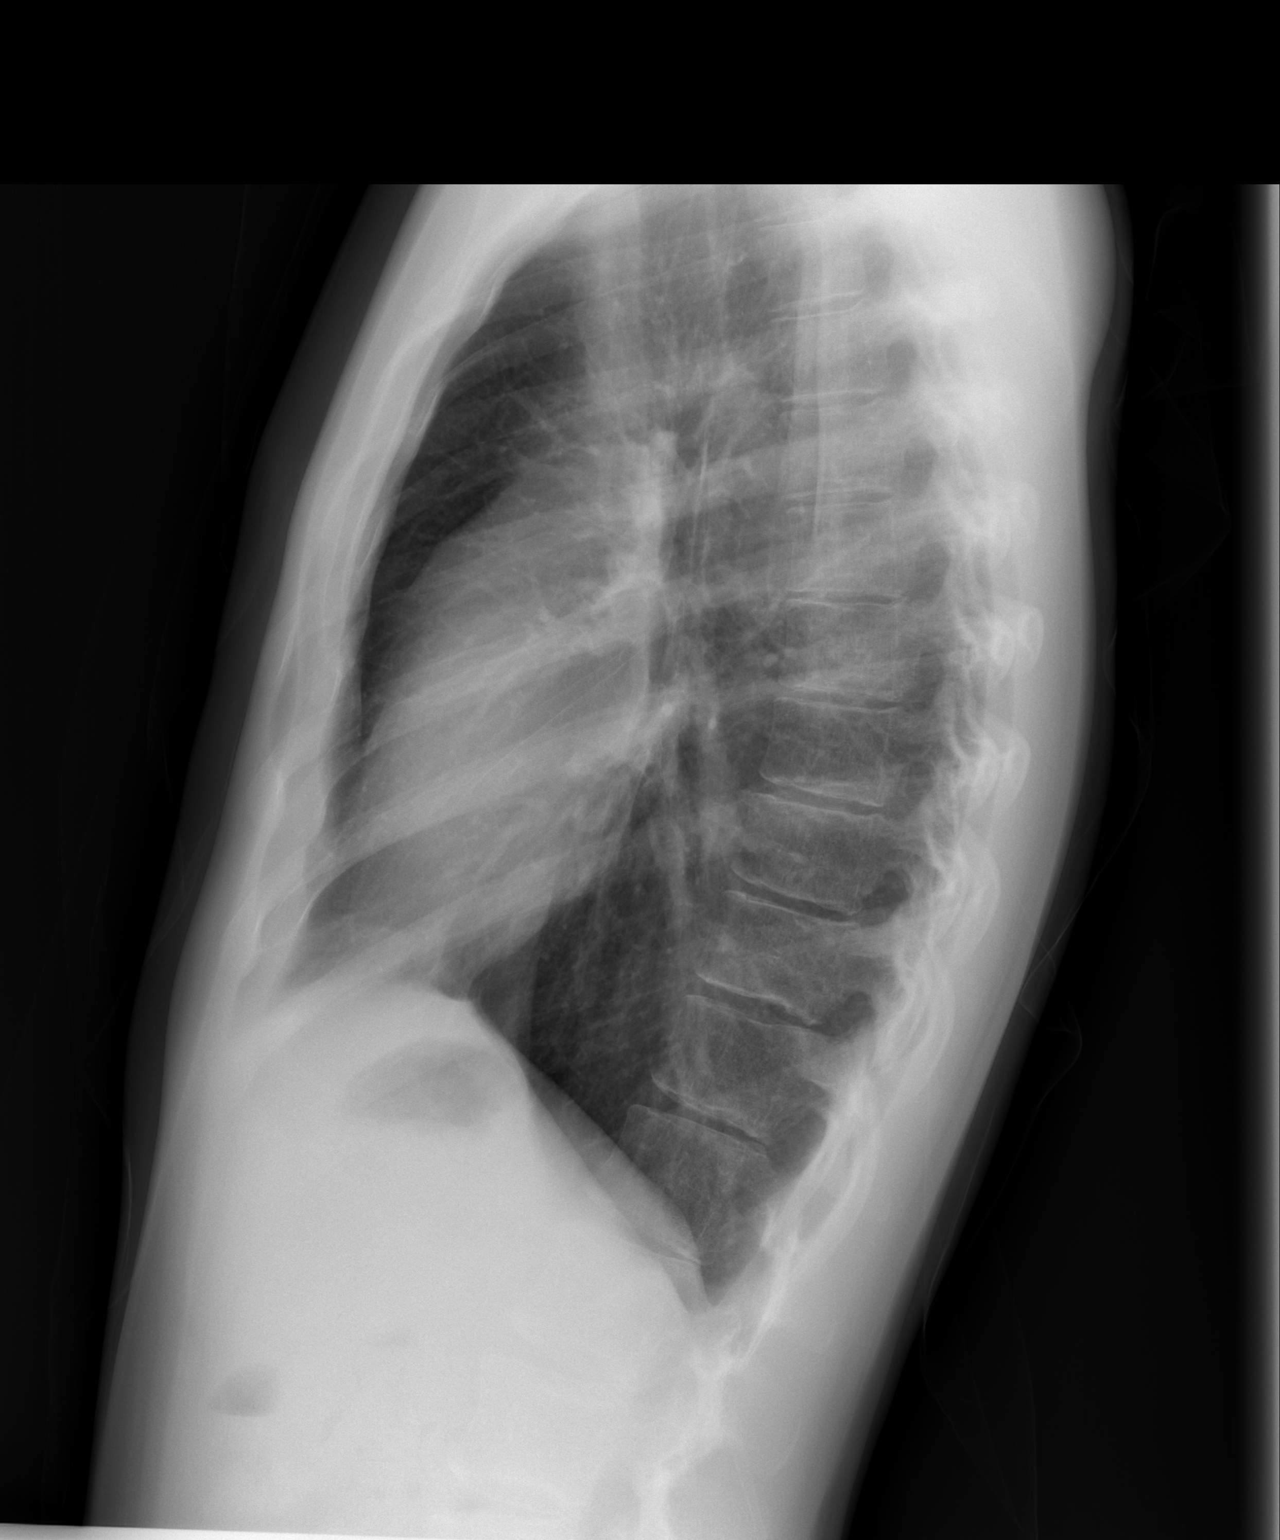

[2 of 2 positions shown; findings below may reference images not displayed]

FINDINGS: Cardiomediastinal silhouette is normal. No pleural effusions or
focal consolidations. Trachea projects midline and there is no
pneumothorax. Soft tissue planes and included osseous structures are
non-suspicious.
IMPRESSION: Normal.

## 2019-07-24 ENCOUNTER — Ambulatory Visit: Payer: 59 | Admitting: Psychology

## 2019-08-07 ENCOUNTER — Ambulatory Visit (INDEPENDENT_AMBULATORY_CARE_PROVIDER_SITE_OTHER): Payer: 59 | Admitting: Psychology

## 2019-08-07 DIAGNOSIS — F331 Major depressive disorder, recurrent, moderate: Secondary | ICD-10-CM

## 2019-08-21 ENCOUNTER — Ambulatory Visit: Payer: 59 | Admitting: Psychology

## 2019-09-14 MED FILL — NORMAL SALINE FLUSH SYRINGE: 0.9 | 30 days supply | Qty: 900 | Fill #4

## 2019-09-18 ENCOUNTER — Ambulatory Visit (INDEPENDENT_AMBULATORY_CARE_PROVIDER_SITE_OTHER): Payer: 59 | Admitting: Psychology

## 2019-09-18 DIAGNOSIS — F331 Major depressive disorder, recurrent, moderate: Secondary | ICD-10-CM

## 2019-10-02 ENCOUNTER — Ambulatory Visit (INDEPENDENT_AMBULATORY_CARE_PROVIDER_SITE_OTHER): Payer: 59 | Admitting: Psychology

## 2019-10-02 DIAGNOSIS — F331 Major depressive disorder, recurrent, moderate: Secondary | ICD-10-CM | POA: Diagnosis not present

## 2019-10-16 ENCOUNTER — Ambulatory Visit: Payer: 59 | Admitting: Psychology

## 2019-10-29 MED FILL — NORMAL SALINE FLUSH SYRINGE: 0.9 | 10 days supply | Qty: 300 | Fill #5

## 2019-10-30 ENCOUNTER — Ambulatory Visit (INDEPENDENT_AMBULATORY_CARE_PROVIDER_SITE_OTHER): Payer: 59 | Admitting: Psychology

## 2019-10-30 DIAGNOSIS — F331 Major depressive disorder, recurrent, moderate: Secondary | ICD-10-CM | POA: Diagnosis not present

## 2019-11-08 MED FILL — NORMAL SALINE FLUSH SYRINGE: 0.9 | 30 days supply | Qty: 900 | Fill #0

## 2019-11-13 ENCOUNTER — Ambulatory Visit: Payer: 59 | Admitting: Psychology

## 2019-11-25 ENCOUNTER — Encounter: Payer: Self-pay | Admitting: Psychiatry

## 2019-11-25 ENCOUNTER — Ambulatory Visit (INDEPENDENT_AMBULATORY_CARE_PROVIDER_SITE_OTHER): Payer: 59 | Admitting: Psychiatry

## 2019-11-25 VITALS — Ht 69.0 in | Wt 120.0 lb

## 2019-11-25 DIAGNOSIS — F339 Major depressive disorder, recurrent, unspecified: Secondary | ICD-10-CM

## 2019-11-25 DIAGNOSIS — F82 Specific developmental disorder of motor function: Secondary | ICD-10-CM

## 2019-11-25 DIAGNOSIS — F411 Generalized anxiety disorder: Secondary | ICD-10-CM | POA: Diagnosis not present

## 2019-11-25 DIAGNOSIS — F4010 Social phobia, unspecified: Secondary | ICD-10-CM

## 2019-11-25 DIAGNOSIS — F900 Attention-deficit hyperactivity disorder, predominantly inattentive type: Secondary | ICD-10-CM

## 2019-11-25 DIAGNOSIS — F800 Phonological disorder: Secondary | ICD-10-CM

## 2019-11-25 MED ORDER — MIRTAZAPINE 15 MG PO TABS: ORAL_TABLET | ORAL | 1 refills | Status: AC

## 2019-11-25 MED ORDER — VORTIOXETINE HBR 20 MG PO TABS: ORAL_TABLET | ORAL | 5 refills | Status: AC

## 2019-11-25 MED ORDER — BUPROPION HCL ER (SR) 100 MG PO TB12: 100.0000 mg | ORAL_TABLET | Freq: Two times a day (BID) | ORAL | 1 refills | Status: AC

## 2019-11-25 NOTE — Progress Notes (Signed)
[facility]  Patient ID: Ricky Williams,  MRN: [id number]  PCP: Ricky Arabian, MD  Date of Evaluation: 11/25/2019 Time spent:20 minutes from 1540 to 1600  Chief Complaint:  Chief Complaint    Anxiety; Depression; ADHD      HISTORY/CURRENT STATUS: Ricky Williams is provided telemedicine audiovisual appointment session, declining videocamera due to current copiing with cancer, phone to phone with consent conjointly with mother with epic collateral for psychiatric interview and exam in 106-month evaluation and management of recurrent major depression resistant to treatment, social and generalized anxiety, and ADHD with other learning obstacles fixating his attempted college in freshman year at [employer].  After struggling since February 2016 in care here to get through high school, the patient's college adaptation to freshman year has been interrupted also by his cancer in addition to his anxiety and depression though he accomplished little educationally.  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  As the tumor is spreading again, he now has an interim oral treatment approved by insurance for 6 weeks.  Oncology is treating with Ativan more appropriate for nausea than the Xanax which can be stopped as the Ativan will help his anxiety and insomnia.  Ricky Williams should be stopped for his current medical status though mother hesitates to stop the Wellbutrin despite weight loss as he continues Trintellix and Remeron.  His medical status seems precarious, but patient and mother do not discuss issues of morbidity and mortality.  We review symptoms for diagnoses and treatment past and present.  He is not manic, psychotic, delirious, or suicidal.   Depression             The patient presents  withdepression as a recurrenttreatment resistant problem of duration more than 5 years. The onset quality was gradual. The problem occurs constantly.The problem has been waxing and waning but persistentsince onset.Associated symptoms include decreased concentration,decreased interest, anorexia with difficulty maintaining weight, fatigue,helplessness,hopelessnessand sad. Associated symptoms include no irritability, no headaches,no indigestionand no suicidal ideas.The symptoms are aggravated by medication, social issues and family issues.Past treatments include SNRIs - Serotonin and norepinephrine reuptake inhibitors, TCAs - Tricyclic antidepressants, SSRIs - Selective serotonin reuptake inhibitors, psychotherapy and other medications.Compliance with treatment is good.Past compliance problems include difficulty with treatment plan, medication issues and medical issues.Previous treatment provided mildrelief.Risk factors include a recent illness, family history, family history of mental illness, history of mental illness and major life event. Past medical history includes recent illness,life-threatening condition,physical disability,anxiety,depressionand mental health disorder. Pertinent negatives include no recent psychiatric admission,no bipolar disorder,no eating disorder,no obsessive-compulsive disorder,no post-traumatic stress disorder,no schizophrenia,no suicide attemptsand no head trauma.  Individual Medical History/ Review of Systems: Changes? 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  They do process his psychiatric intolerance for dexamethasone for his nausea making him restless with anxiety but reducing the dose significantly still made depression worse so that a replacement for the dexamethasone was found.  [place] registry is appropriate for medical  needs and use.  Individual Medical History/ Review of Systems: Changes? :Yes  Mother estimates weight down to be 120 pounds down from maximum of 135 pounds having hydronephrosis from tumor requiring ostomy as he continues to diligently work with oncology.  Allergies: Adhesive [tape] and Emend [aprepitant]  Current Medications:  Current Outpatient Medications:  .  acetaminophen (TYLENOL) 325 MG tablet, Take 650 mg by mouth every 6 (six) hours as needed for mild pain or fever. , Disp: , Rfl:  .  buPROPion (WELLBUTRIN SR) 100 MG 12 hr tablet, Take 1 tablet (100 mg total) by mouth 2 (two) times daily., Disp: 180 tablet, Rfl: 1 .  chlorhexidine (PERIDEX) 0.12 % solution, 30 mLs by Mouth Rinse route daily., Disp: , Rfl:  .  dexamethasone (DECADRON) 4 MG tablet, Take 4 mg by mouth daily. 3 days following chemo, Disp: , Rfl: 0 .  mirtazapine (REMERON) 15 MG tablet, TAKE 1 TABLET BY MOUTH EVERYDAY AT BEDTIME, Disp: 90 tablet, Rfl: 1 .  Multiple Vitamins-Minerals (MULTIVITAMIN WITH MINERALS) tablet, Take 1 tablet by mouth daily., Disp: , Rfl:  .  NEULASTA 6 MG/0.6ML injection, , Disp: , Rfl:  .  OLANZapine (ZYPREXA) 5 MG tablet, Take 1-2 tablets (5-10 mg total) by mouth at bedtime. For chemotherapy induced nausea. (Patient not taking: Reported on 01/10/2019), Disp: 40 tablet, Rfl: 1 .  ondansetron (ZOFRAN) 8 MG tablet, Take 8 mg by mouth 3 (three) times daily as needed for nausea/vomiting., Disp: , Rfl:  .  oxyCODONE (OXY IR/ROXICODONE) 5 MG immediate release tablet, TAKE 1 2 TABLETS BY MOUTH EVERY 4 HOURS AS NEEDED FOR PAIN(1 TAB   MODERATE PAIN 2 TAB  SEVERE PAIN), Disp: , Rfl:  .  Polyethylene Glycol 3350 (PEG 3350) POWD, Take 17 g by mouth daily as needed for constipation., Disp: , Rfl:  .  predniSONE (DELTASONE) 10 MG tablet, TAKE 2 TABLETS BY MOUTH EVERY DAY WITH BREAKFAST FOR 3 DAYS, Disp: , Rfl:  .  prochlorperazine (COMPAZINE) 10 MG tablet, Take 1 tablet (10 mg total) by mouth once for 1 dose.  (Patient taking differently: Take 10 mg by mouth daily as needed for nausea. ), Disp: 30 tablet, Rfl: 0 .  Sodium Chloride Flush (NORMAL SALINE FLUSH) 0.9 % SOLN, , Disp: , Rfl:  .  vortioxetine HBr (TRINTELLIX) 20 MG TABS tablet, TAKE 1 TABLET BY MOUTH EVERY DAY AT NIGHT, Disp: 30 tablet, Rfl: 5   Medication Side Effects: weight loss  Family Medical/ Social History: Changes? No  MENTAL HEALTH EXAM:  Height 5\' 9"  (1.753 m), weight 120 lb (54.4 kg).Body mass index is 17.72 kg/m.  as not present here today.  General Appearance: N/A  Eye Contact:  N/A  Speech:  Clear and Coherent, Slow and Talkative  Volume:  Decreased  Mood:  Anxious, Depressed, Dysphoric and Hopeless  Affect:  Congruent, Depressed, Inappropriate, Restricted and Anxious  Thought Process:  Coherent, Irrelevant, Linear and Descriptions of Associations: Tangential  Orientation:  Full (Time, Place, and Person)  Thought Content: Ilusions, Rumination and Tangential   Suicidal Thoughts:  No  Homicidal Thoughts:  No  Memory:  Immediate;   Good and Fair Remote;   Good and Fair  Judgement:  Fair  Insight:  Fair  Psychomotor Activity:  N/A  Concentration:  Concentration: Fair and Attention Span: Poor  Recall:  Fair to poor  [employer] of Knowledge: Fair  Language: Fair  Assets:  Desire for Improvement Intimacy Resilience  ADL's:  Intact  Cognition: WNL  Prognosis:  Poor    DIAGNOSES:    ICD-10-CM   1. Recurrent major depression resistant to treatment (HCC)  F33.9 vortioxetine HBr (TRINTELLIX) 20 MG TABS tablet    mirtazapine (REMERON) 15 MG tablet    buPROPion (WELLBUTRIN SR) 100 MG 12 hr tablet  2. ADHD, predominantly  inattentive type  F90.0 vortioxetine HBr (TRINTELLIX) 20 MG TABS tablet    buPROPion (WELLBUTRIN SR) 100 MG 12 hr tablet  3. Generalized anxiety disorder  F41.1 vortioxetine HBr (TRINTELLIX) 20 MG TABS tablet    mirtazapine (REMERON) 15 MG tablet    buPROPion (WELLBUTRIN SR) 100 MG 12 hr tablet  4.  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 Developmental coordination disorder  F82     Receiving Psychotherapy: No    RECOMMENDATIONS: Psychosupportive psychoeducation recruits and reinforces coping strategies as possible for patient also acknowledging and appreciating the support and persistence of parents.  At this time, Ricky Williams continuation is finalized as stressors and adaptation are now more medical than social or educational.  He continues Trintellix 20 mg nightly sent as #30 with 5 refills to [facility] [employer] [facility] [employer] in [place] for depression and anxiety.  He is E scribed to continue Remeron 15 mg at bedtime sent as #90 with 1 refill to [facility] at [facility] in [place] for depression and anxiety.  Mother is hesitant to stop the Wellbutrin despite its potential for appetite reduction as it has been instrumental to his depression and ADHD stabilization in the past sent as #180 with 1 refill of 100 mg SR twice daily for depression and ADHD to [facility] at [facility] in [place].  They may discontinue the Xanax and use the Ativan from oncology for anxiety and insomnia as needed as he is using Xanax infrequently.  The plan follow-up in 6 months or sooner if needed.  Delight Hoh, MD

## 2019-11-27 ENCOUNTER — Ambulatory Visit (INDEPENDENT_AMBULATORY_CARE_PROVIDER_SITE_OTHER): Payer: 59 | Admitting: Psychology

## 2019-11-27 DIAGNOSIS — F331 Major depressive disorder, recurrent, moderate: Secondary | ICD-10-CM | POA: Diagnosis not present

## 2019-12-11 ENCOUNTER — Ambulatory Visit (INDEPENDENT_AMBULATORY_CARE_PROVIDER_SITE_OTHER): Payer: 59 | Admitting: Psychology

## 2019-12-11 DIAGNOSIS — F331 Major depressive disorder, recurrent, moderate: Secondary | ICD-10-CM

## 2019-12-17 MED FILL — NORMAL SALINE FLUSH SYRINGE: 0.9 | 30 days supply | Qty: 900 | Fill #0

## 2019-12-25 ENCOUNTER — Ambulatory Visit: Payer: 59 | Admitting: Psychology

## 2020-01-08 ENCOUNTER — Ambulatory Visit (INDEPENDENT_AMBULATORY_CARE_PROVIDER_SITE_OTHER): Payer: 59 | Admitting: Psychology

## 2020-01-08 DIAGNOSIS — F331 Major depressive disorder, recurrent, moderate: Secondary | ICD-10-CM | POA: Diagnosis not present

## 2020-01-13 ENCOUNTER — Ambulatory Visit: Payer: 59 | Admitting: Psychology

## 2020-01-22 ENCOUNTER — Ambulatory Visit: Payer: 59 | Admitting: Psychology

## 2020-02-04 DEATH — deceased

## 2020-02-05 ENCOUNTER — Ambulatory Visit: Payer: 59 | Admitting: Psychology

## 2020-02-19 ENCOUNTER — Ambulatory Visit: Payer: 59 | Admitting: Psychology

## 2020-03-04 ENCOUNTER — Ambulatory Visit: Payer: 59 | Admitting: Psychology

## 2020-03-04 IMAGING — US US SCROTUM W/ DOPPLER COMPLETE
1 series · 13 of 25 positions shown · non-contrast
Comparison: None.

CLINICAL DATA: Scrotal pain. History of a left orchiectomy for
testicular carcinoma

EXAM:
SCROTAL ULTRASOUND
DOPPLER ULTRASOUND OF THE TESTICLES
TECHNIQUE: Complete ultrasound examination of the testicles, epididymis, and
other scrotal structures was performed. Color and spectral Doppler
ultrasound were also utilized to evaluate blood flow to the
testicles.

[Series 1: us scrotum w/ doppler complete · 13 of 41 slices shown]
[im 1/41]
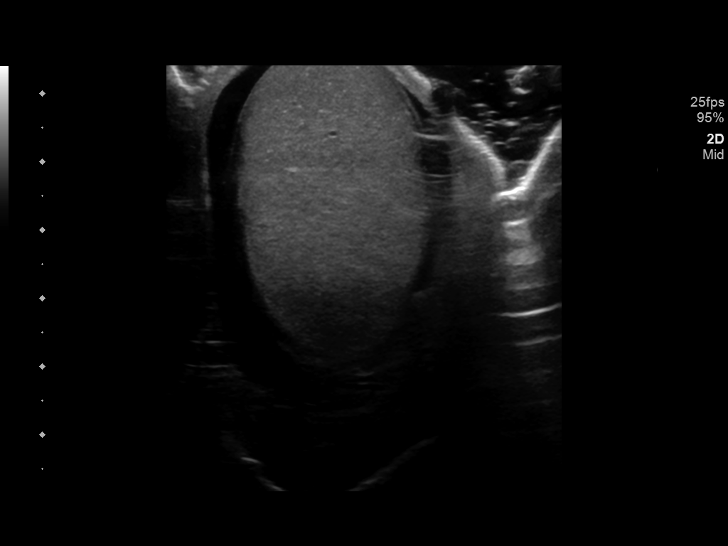
[im 4/41]
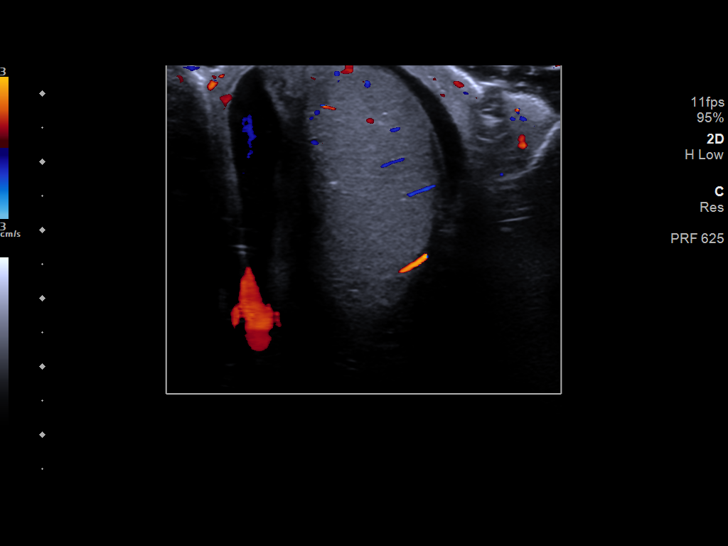
[im 7/41]
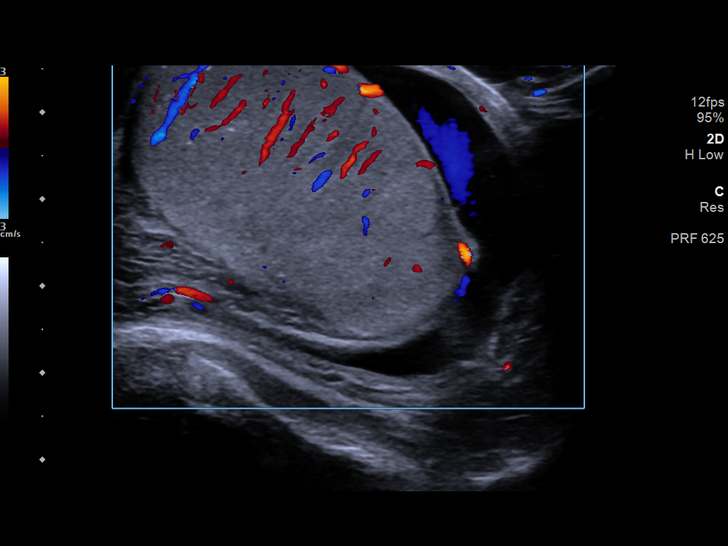
[im 11/41]
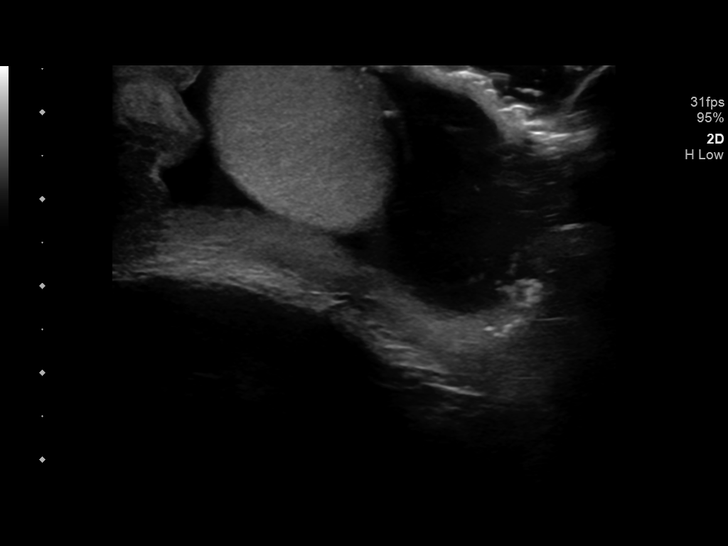
[im 14/41]
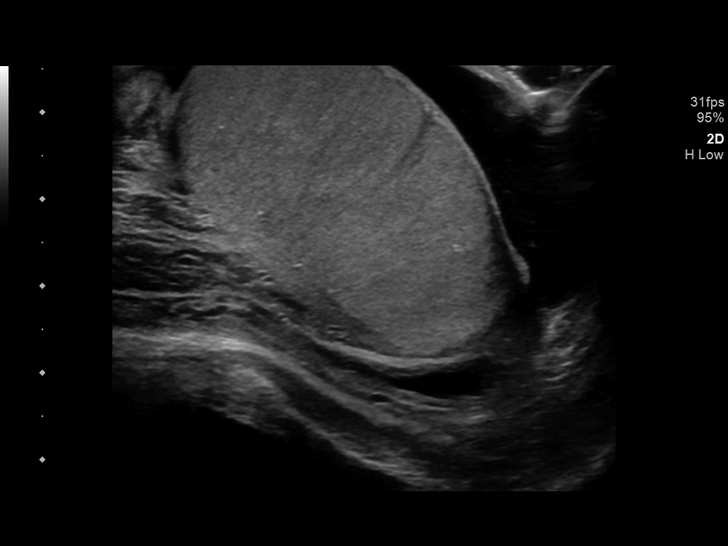
[im 17/41]
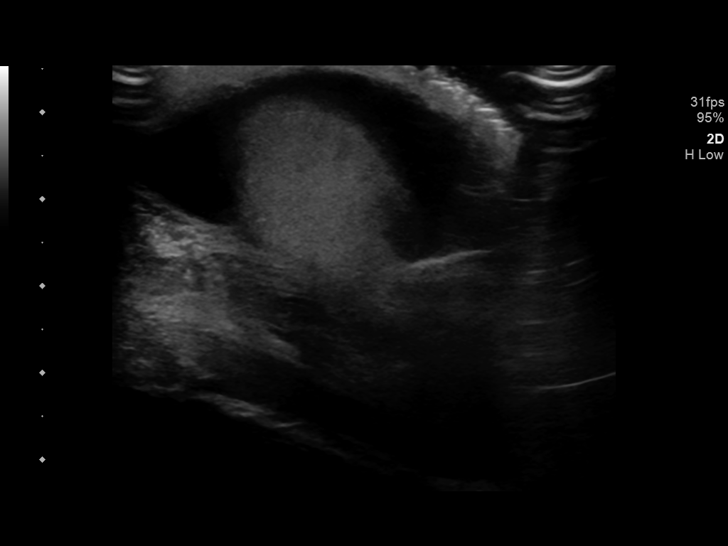
[im 21/41]
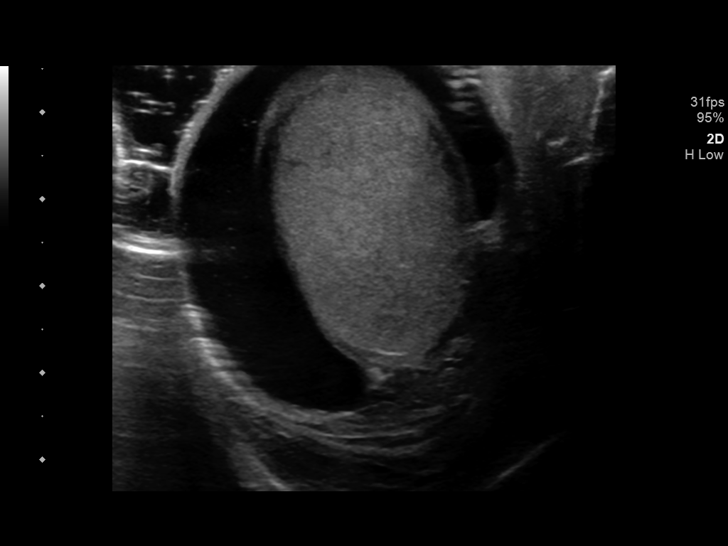
[im 24/41]
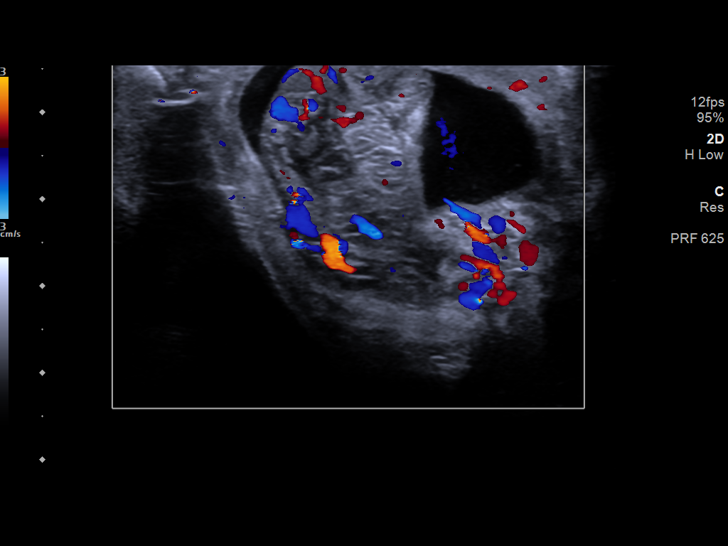
[im 27/41]
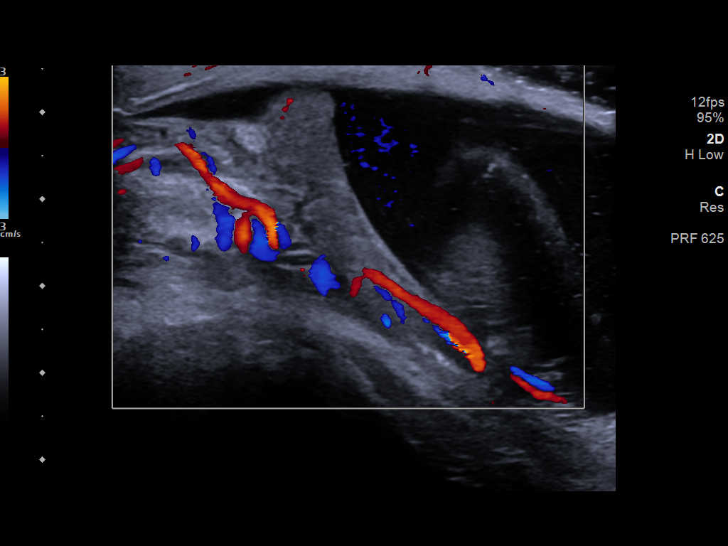
[im 31/41]
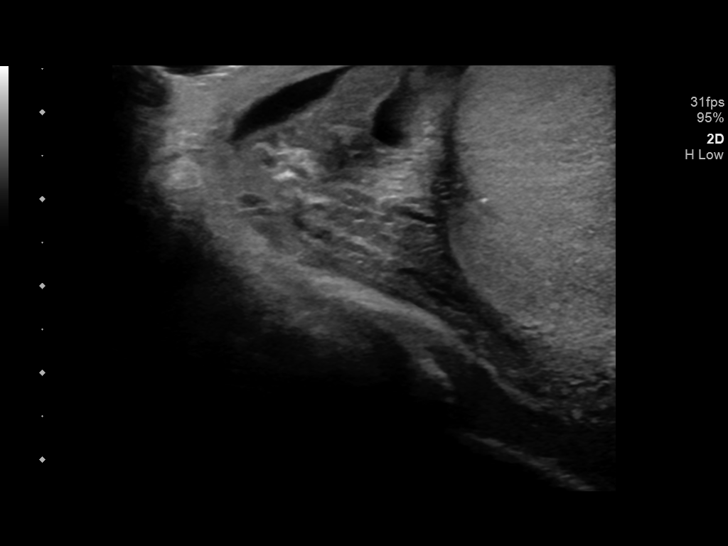
[im 34/41]
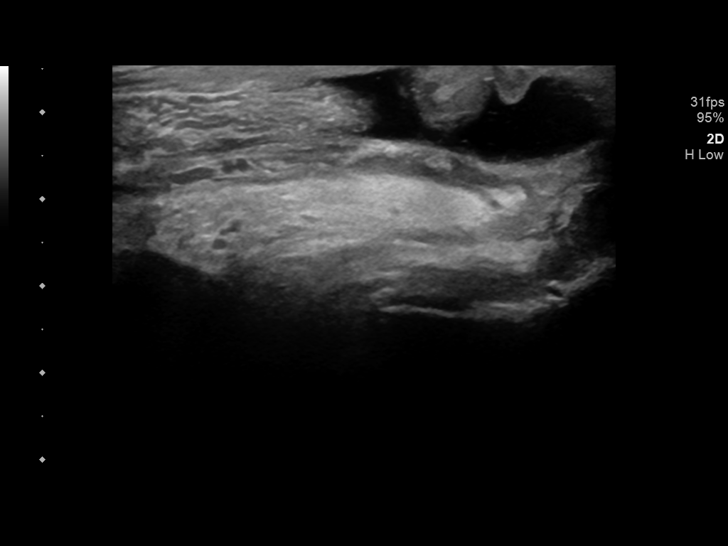
[im 37/41]
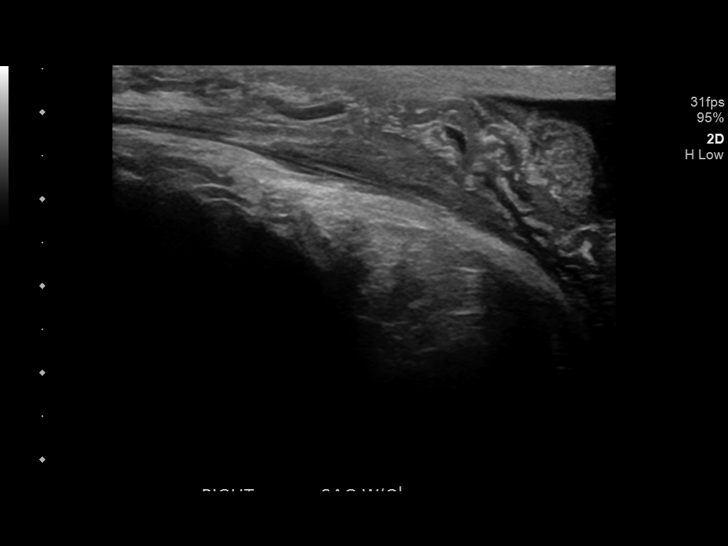
[im 41/41]
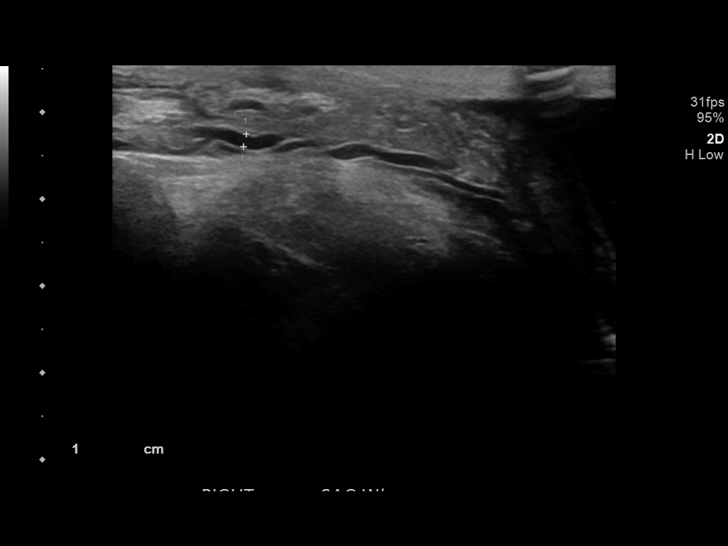

[13 of 25 positions shown; findings below may reference images not displayed]

FINDINGS: Right testicle

Measurements: 4.5 x 2.7 x 3.1 cm. No masses. There are scattered
small calcifications consistent with microlithiasis.

Left testicle

Surgically absent

Right epididymis:  Enlarged, heterogeneous and hypervascular.

Left epididymis:  Surgically absent

Hydrocele:  Small to moderate hydrocele.

Varicocele:  None visualized.

Pulsed Doppler interrogation of the right testis demonstrates normal
low resistance arterial and venous waveforms bilaterally.
IMPRESSION: 1. Right epididymitis.
2. No testicular masses. Right testicular microlithiasis. Current
literature suggests that testicular microlithiasis is not a
significant independent risk factor for development of testicular
carcinoma, and that follow up imaging is not warranted in the
absence of other risk factors. Monthly testicular self-examination
and annual physical exams are considered appropriate surveillance.
If patient has other risk factors for testicular carcinoma, then
referral to Urology should be considered. (Reference: Romelia, et
al.: A 5-Year Follow up Study of Asymptomatic Men with Testicular
Microlithiasis. J Urol 9990; [id number].)
3. Small to moderate right hydrocele.

## 2020-03-18 ENCOUNTER — Ambulatory Visit: Payer: 59 | Admitting: Psychology

## 2020-04-01 ENCOUNTER — Ambulatory Visit: Payer: 59 | Admitting: Psychology

## 2020-04-15 ENCOUNTER — Ambulatory Visit: Payer: 59 | Admitting: Psychology

## 2020-04-29 ENCOUNTER — Ambulatory Visit: Payer: 59 | Admitting: Psychology

## 2020-05-13 ENCOUNTER — Ambulatory Visit: Payer: 59 | Admitting: Psychology

## 2020-05-27 ENCOUNTER — Ambulatory Visit: Payer: 59 | Admitting: Psychology

## 2020-06-10 ENCOUNTER — Ambulatory Visit: Payer: 59 | Admitting: Psychology

## 2020-06-24 ENCOUNTER — Ambulatory Visit: Payer: 59 | Admitting: Psychology

## 2020-06-24 ENCOUNTER — Encounter: Payer: Self-pay | Admitting: Psychiatry
